# Patient Record
Sex: Male | Born: 1970 | Race: White | Hispanic: No | Smoking: Current every day smoker
Health system: Southern US, Community
[De-identification: ages and names within clinical notes are randomized; demographics above are authoritative.]

## PROBLEM LIST (undated history)

## (undated) DIAGNOSIS — Z9114 Patient's other noncompliance with medication regimen: Secondary | ICD-10-CM

## (undated) DIAGNOSIS — M549 Dorsalgia, unspecified: Secondary | ICD-10-CM

## (undated) DIAGNOSIS — Z91148 Patient's other noncompliance with medication regimen for other reason: Secondary | ICD-10-CM

## (undated) DIAGNOSIS — G8929 Other chronic pain: Secondary | ICD-10-CM

## (undated) DIAGNOSIS — I10 Essential (primary) hypertension: Secondary | ICD-10-CM

## (undated) DIAGNOSIS — F1910 Other psychoactive substance abuse, uncomplicated: Secondary | ICD-10-CM

## (undated) DIAGNOSIS — B1920 Unspecified viral hepatitis C without hepatic coma: Secondary | ICD-10-CM

## (undated) HISTORY — PX: HERNIA REPAIR: SHX51

---

## 2001-04-02 ENCOUNTER — Emergency Department (HOSPITAL_COMMUNITY): Admission: EM | Admit: 2001-04-02 | Discharge: 2001-04-02 | Payer: Self-pay | Admitting: Emergency Medicine

## 2009-03-30 ENCOUNTER — Emergency Department (HOSPITAL_COMMUNITY): Admission: EM | Admit: 2009-03-30 | Discharge: 2009-03-30 | Payer: Self-pay | Admitting: Emergency Medicine

## 2009-09-09 ENCOUNTER — Emergency Department (HOSPITAL_COMMUNITY): Admission: EM | Admit: 2009-09-09 | Discharge: 2009-09-09 | Payer: Self-pay | Admitting: Emergency Medicine

## 2010-02-07 ENCOUNTER — Emergency Department (HOSPITAL_COMMUNITY): Admission: EM | Admit: 2010-02-07 | Discharge: 2010-02-08 | Payer: Self-pay | Admitting: Emergency Medicine

## 2010-06-27 ENCOUNTER — Emergency Department (HOSPITAL_COMMUNITY)
Admission: EM | Admit: 2010-06-27 | Discharge: 2010-06-27 | Payer: Self-pay | Source: Home / Self Care | Admitting: Emergency Medicine

## 2010-09-06 ENCOUNTER — Emergency Department (HOSPITAL_COMMUNITY)
Admission: EM | Admit: 2010-09-06 | Discharge: 2010-09-06 | Disposition: A | Discharge status: Home / Self Care | Payer: Self-pay | Attending: Emergency Medicine | Admitting: Emergency Medicine

## 2010-09-06 DIAGNOSIS — X500XXA Overexertion from strenuous movement or load, initial encounter: Secondary | ICD-10-CM | POA: Insufficient documentation

## 2010-09-06 DIAGNOSIS — Y92009 Unspecified place in unspecified non-institutional (private) residence as the place of occurrence of the external cause: Secondary | ICD-10-CM | POA: Insufficient documentation

## 2010-09-06 DIAGNOSIS — M545 Low back pain, unspecified: Secondary | ICD-10-CM | POA: Insufficient documentation

## 2010-09-06 DIAGNOSIS — S335XXA Sprain of ligaments of lumbar spine, initial encounter: Secondary | ICD-10-CM | POA: Insufficient documentation

## 2010-09-06 DIAGNOSIS — B9789 Other viral agents as the cause of diseases classified elsewhere: Secondary | ICD-10-CM | POA: Insufficient documentation

## 2010-09-06 DIAGNOSIS — R011 Cardiac murmur, unspecified: Secondary | ICD-10-CM | POA: Insufficient documentation

## 2010-09-06 DIAGNOSIS — I10 Essential (primary) hypertension: Secondary | ICD-10-CM | POA: Insufficient documentation

## 2010-09-09 LAB — DIFFERENTIAL
Basophils Absolute: 0.1 10*3/uL (ref 0.0–0.1)
Basophils Relative: 1 % (ref 0–1)
Eosinophils Absolute: 0.5 10*3/uL (ref 0.0–0.7)
Eosinophils Relative: 5 % (ref 0–5)
Lymphocytes Relative: 33 % (ref 12–46)
Lymphs Abs: 3.3 10*3/uL (ref 0.7–4.0)
Monocytes Relative: 11 % (ref 3–12)
Neutro Abs: 5.1 10*3/uL (ref 1.7–7.7)
Neutrophils Relative %: 51 % (ref 43–77)

## 2010-09-09 LAB — CBC
MCHC: 35.3 g/dL (ref 30.0–36.0)
MCV: 94.3 fL (ref 78.0–100.0)
Platelets: 193 10*3/uL (ref 150–400)
RDW: 13.1 % (ref 11.5–15.5)
WBC: 10.1 10*3/uL (ref 4.0–10.5)

## 2010-09-09 LAB — RAPID URINE DRUG SCREEN, HOSP PERFORMED
Amphetamines: NOT DETECTED
Benzodiazepines: POSITIVE — AB
Opiates: POSITIVE — AB
Tetrahydrocannabinol: POSITIVE — AB

## 2010-09-09 LAB — POCT I-STAT, CHEM 8: Chloride: 108 mEq/L (ref 96–112)

## 2010-09-13 ENCOUNTER — Emergency Department (HOSPITAL_COMMUNITY)
Admission: EM | Admit: 2010-09-13 | Discharge: 2010-09-13 | Disposition: A | Discharge status: Home / Self Care | Payer: Self-pay | Attending: Emergency Medicine | Admitting: Emergency Medicine

## 2010-09-13 DIAGNOSIS — T1590XA Foreign body on external eye, part unspecified, unspecified eye, initial encounter: Secondary | ICD-10-CM | POA: Insufficient documentation

## 2010-09-13 DIAGNOSIS — Y92009 Unspecified place in unspecified non-institutional (private) residence as the place of occurrence of the external cause: Secondary | ICD-10-CM | POA: Insufficient documentation

## 2010-10-06 ENCOUNTER — Emergency Department (HOSPITAL_COMMUNITY): Payer: Self-pay

## 2010-10-06 ENCOUNTER — Emergency Department (HOSPITAL_COMMUNITY)
Admission: EM | Admit: 2010-10-06 | Discharge: 2010-10-06 | Disposition: A | Discharge status: Home / Self Care | Payer: Self-pay | Attending: Emergency Medicine | Admitting: Emergency Medicine

## 2010-10-06 DIAGNOSIS — M25476 Effusion, unspecified foot: Secondary | ICD-10-CM | POA: Insufficient documentation

## 2010-10-06 DIAGNOSIS — X500XXA Overexertion from strenuous movement or load, initial encounter: Secondary | ICD-10-CM | POA: Insufficient documentation

## 2010-10-06 DIAGNOSIS — I10 Essential (primary) hypertension: Secondary | ICD-10-CM | POA: Insufficient documentation

## 2010-10-06 DIAGNOSIS — S93409A Sprain of unspecified ligament of unspecified ankle, initial encounter: Secondary | ICD-10-CM | POA: Insufficient documentation

## 2010-10-06 DIAGNOSIS — S99919A Unspecified injury of unspecified ankle, initial encounter: Secondary | ICD-10-CM | POA: Insufficient documentation

## 2010-10-06 DIAGNOSIS — S8990XA Unspecified injury of unspecified lower leg, initial encounter: Secondary | ICD-10-CM | POA: Insufficient documentation

## 2010-10-06 DIAGNOSIS — M25473 Effusion, unspecified ankle: Secondary | ICD-10-CM | POA: Insufficient documentation

## 2010-10-06 DIAGNOSIS — Y92009 Unspecified place in unspecified non-institutional (private) residence as the place of occurrence of the external cause: Secondary | ICD-10-CM | POA: Insufficient documentation

## 2010-10-06 DIAGNOSIS — M25579 Pain in unspecified ankle and joints of unspecified foot: Secondary | ICD-10-CM | POA: Insufficient documentation

## 2010-10-06 DIAGNOSIS — Z8619 Personal history of other infectious and parasitic diseases: Secondary | ICD-10-CM | POA: Insufficient documentation

## 2012-05-12 ENCOUNTER — Emergency Department (HOSPITAL_COMMUNITY)
Admission: EM | Admit: 2012-05-12 | Discharge: 2012-05-12 | Disposition: A | Discharge status: Home / Self Care | Payer: Self-pay | Attending: Emergency Medicine | Admitting: Emergency Medicine

## 2012-05-12 ENCOUNTER — Encounter (HOSPITAL_COMMUNITY): Payer: Self-pay | Admitting: Emergency Medicine

## 2012-05-12 DIAGNOSIS — F1410 Cocaine abuse, uncomplicated: Secondary | ICD-10-CM | POA: Insufficient documentation

## 2012-05-12 DIAGNOSIS — I10 Essential (primary) hypertension: Secondary | ICD-10-CM | POA: Insufficient documentation

## 2012-05-12 DIAGNOSIS — B1920 Unspecified viral hepatitis C without hepatic coma: Secondary | ICD-10-CM | POA: Insufficient documentation

## 2012-05-12 DIAGNOSIS — F1910 Other psychoactive substance abuse, uncomplicated: Secondary | ICD-10-CM

## 2012-05-12 DIAGNOSIS — F17200 Nicotine dependence, unspecified, uncomplicated: Secondary | ICD-10-CM | POA: Insufficient documentation

## 2012-05-12 DIAGNOSIS — F1110 Opioid abuse, uncomplicated: Secondary | ICD-10-CM | POA: Insufficient documentation

## 2012-05-12 HISTORY — DX: Unspecified viral hepatitis C without hepatic coma: B19.20

## 2012-05-12 HISTORY — DX: Essential (primary) hypertension: I10

## 2012-05-12 LAB — CBC WITH DIFFERENTIAL/PLATELET
Basophils Absolute: 0.1 10*3/uL (ref 0.0–0.1)
Eosinophils Relative: 3 % (ref 0–5)
HCT: 45.5 % (ref 39.0–52.0)
Hemoglobin: 16 g/dL (ref 13.0–17.0)
MCH: 32.9 pg (ref 26.0–34.0)
MCV: 93.6 fL (ref 78.0–100.0)
Monocytes Relative: 10 % (ref 3–12)
Neutro Abs: 6.3 10*3/uL (ref 1.7–7.7)
Neutrophils Relative %: 51 % (ref 43–77)
RDW: 13.4 % (ref 11.5–15.5)
WBC: 12.4 10*3/uL — ABNORMAL HIGH (ref 4.0–10.5)

## 2012-05-12 LAB — RAPID URINE DRUG SCREEN, HOSP PERFORMED
Barbiturates: NOT DETECTED
Benzodiazepines: POSITIVE — AB
Tetrahydrocannabinol: POSITIVE — AB

## 2012-05-12 LAB — BASIC METABOLIC PANEL
Chloride: 107 mEq/L (ref 96–112)
Creatinine, Ser: 1.21 mg/dL (ref 0.50–1.35)
Glucose, Bld: 145 mg/dL — ABNORMAL HIGH (ref 70–99)
Sodium: 143 mEq/L (ref 135–145)

## 2012-05-12 MED ORDER — LORAZEPAM 1 MG PO TABS
1.0000 mg | ORAL_TABLET | Freq: Once | ORAL | Status: AC
  Administered 2012-05-12: 1 mg via ORAL
  Filled 2012-05-12: qty 1

## 2012-05-12 NOTE — ED Notes (Signed)
Pt wants help to get off pain meds and crack cocaine. Pt states he has used both this weekend.

## 2012-05-12 NOTE — ED Provider Notes (Signed)
History   This chart was scribed for Gerald Lennert, MD, by Frederik Pear, ER scribe. The patient was seen in room APA03/APA03 and the patient's care was started at 1635.    CSN: [id number]  Arrival date & time 05/12/12  1620   First MD Initiated Contact with Patient 05/12/12 1635      Chief Complaint  Patient presents with  . V70.1    (Consider location/radiation/quality/duration/timing/severity/associated sxs/prior treatment) HPI Comments: Gerald Moore is a 41 y.o. male who presents to the Emergency Department wanting assistance getting detox from crack cocaine and prescription pain medication, including oxycodone and hydrocodone. He states that he last used both in the past day. He states that his last inpatient rehab was in 2011, but he had sought inpatient several time prior. He has a h/o of hepatitis C, but he is not currently taking any medication to treat it. He denies any suicidal or homicidal ideation.     Patient is a 41 y.o. male presenting with drug/alcohol assessment and drug problem. The history is provided by the patient.  Drug / Alcohol Assessment Primary symptoms include no seizures, no hallucinations and no self-injury. This is a recurrent problem. The current episode started yesterday. The problem has been gradually worsening. Suspected agents include cocaine, prescription drugs and crack. Pertinent negatives include no fever. Associated medical issues include addiction treatment.  Drug Problem This is a recurrent problem. The current episode started yesterday. The problem occurs constantly. Treatments tried: previous inpatient treatment.    Past Medical History  Diagnosis Date  . Hypertension   . Hepatitis C     Past Surgical History  Procedure Date  . Hernia repair     No family history on file.  History  Substance Use Topics  . Smoking status: Current Every Day Smoker    Types: Cigarettes  . Smokeless tobacco: Not on file  . Alcohol Use: No        Review of Systems  Constitutional: Negative for fever and fatigue.  HENT: Negative for congestion, sinus pressure and ear discharge.   Eyes: Negative for discharge.  Respiratory: Negative for cough.   Gastrointestinal: Negative for diarrhea.  Genitourinary: Negative for frequency and hematuria.  Musculoskeletal: Negative for back pain.  Skin: Negative for rash.  Neurological: Negative for seizures.  Hematological: Negative.   Psychiatric/Behavioral: Negative for suicidal ideas, hallucinations and self-injury.  All other systems reviewed and are negative.    Allergies  Review of patient's allergies indicates no known allergies.  Home Medications  No current outpatient prescriptions on file.  BP 151/94  Pulse 105  Temp 97.9 F (36.6 C) (Oral)  Resp 20  Ht 5\' 6"  (1.676 m)  Wt 190 lb 14.4 oz (86.592 kg)  BMI 30.81 kg/m2  SpO2 99%  Physical Exam  Nursing note and vitals reviewed. Constitutional: He is oriented to person, place, and time. He appears well-developed.  HENT:  Head: Normocephalic and atraumatic.  Eyes: Conjunctivae normal and EOM are normal. No scleral icterus.  Neck: Neck supple. No thyromegaly present.  Cardiovascular: Normal rate and regular rhythm.  Exam reveals no gallop and no friction rub.   No murmur heard. Pulmonary/Chest: No stridor. He has no wheezes. He has no rales. He exhibits no tenderness.  Abdominal: He exhibits no distension. There is no tenderness. There is no rebound.  Musculoskeletal: Normal range of motion. He exhibits no edema.  Lymphadenopathy:    He has no cervical adenopathy.  Neurological: He is oriented to person, place,  and time. Coordination normal.  Skin: No rash noted. No erythema.  Psychiatric: He has a normal mood and affect. His behavior is normal.    ED Course  Procedures (including critical care time)  DIAGNOSTIC STUDIES: Oxygen Saturation is 99% on room air,  normal by my interpretation.    COORDINATION OF  CARE:  16:39- Discussed planned course of treatment with the patient, including a UA and blood work, who is agreeable at this time.  18:35- Consultation to call [facility].  22:15- Medication Orders- Lorazepam (ATIVAN) tablet 1 mg- Once.  Results for orders placed during the hospital encounter of 05/12/12  URINE RAPID DRUG SCREEN (HOSP PERFORMED)      Component Value Range   Opiates POSITIVE (*) NONE DETECTED   Cocaine POSITIVE (*) NONE DETECTED   Benzodiazepines POSITIVE (*) NONE DETECTED   Amphetamines NONE DETECTED  NONE DETECTED   Tetrahydrocannabinol POSITIVE (*) NONE DETECTED   Barbiturates NONE DETECTED  NONE DETECTED  CBC WITH DIFFERENTIAL      Component Value Range   WBC 12.4 (*) 4.0 - 10.5 K/uL   RBC 4.86  4.22 - 5.81 MIL/uL   Hemoglobin 16.0  13.0 - 17.0 g/dL   HCT 16.1  09.6 - 04.5 %   MCV 93.6  78.0 - 100.0 fL   MCH 32.9  26.0 - 34.0 pg   MCHC 35.2  30.0 - 36.0 g/dL   RDW 40.9  81.1 - 91.4 %   Platelets 254  150 - 400 K/uL   Neutrophils Relative 51  43 - 77 %   Neutro Abs 6.3  1.7 - 7.7 K/uL   Lymphocytes Relative 36  12 - 46 %   Lymphs Abs 4.4 (*) 0.7 - 4.0 K/uL   Monocytes Relative 10  3 - 12 %   Monocytes Absolute 1.3 (*) 0.1 - 1.0 K/uL   Eosinophils Relative 3  0 - 5 %   Eosinophils Absolute 0.4  0.0 - 0.7 K/uL   Basophils Relative 0  0 - 1 %   Basophils Absolute 0.1  0.0 - 0.1 K/uL  BASIC METABOLIC PANEL      Component Value Range   Sodium 143  135 - 145 mEq/L   Potassium 3.4 (*) 3.5 - 5.1 mEq/L   Chloride 107  96 - 112 mEq/L   CO2 25  19 - 32 mEq/L   Glucose, Bld 145 (*) 70 - 99 mg/dL   BUN 15  6 - 23 mg/dL   Creatinine, Ser 7.82  0.50 - 1.35 mg/dL   Calcium 95.6 (*) 8.4 - 10.5 mg/dL   GFR calc non Af Amer 73 (*) >90 mL/min   GFR calc Af Amer 85 (*) >90 mL/min  ETHANOL      Component Value Range   Alcohol, Ethyl (B) <11  0 - 11 mg/dL     Labs Reviewed - No data to display No results found.   No diagnosis found.    MDM   The chart was  scribed for me under my direct supervision.  I personally performed the history, physical, and medical decision making and all procedures in the evaluation of this patient.Gerald Lennert, MD 05/12/12 2312

## 2012-05-12 NOTE — BH Assessment (Addendum)
Assessment Note   Gerald Moore is an 41 y.o. male. Patient comes to the emergency room this evening to receive medical clearance. He reports that he is using crack cocaine; has been using since Friday night thru Saturday and has used about $700. He also uses Narcotics: Vicoden, Percocets, Roxicodone. His last use was last night, stating he used about 7 Roxicodone pills. He also uses Xanax, 1 mg; 1-2 pills per day. Also uses Cannabis, 1-2 joints per day. No ETOH. He has been to multiple inpatient treatment programs; detox and rehab programs. He has been to [facility] twice-last detox 2011. He has been to [facility] several times, completing several 14-28 day programs. He has also been to [facility]. He has been to prison once, served 18 months. Did report this is his longest sobriety. He denies SI and HI. No delusions or hallucinations noted. He states that he is at the end of his rope and this time, he wants to directly go to a program once he completes detox. He states his main trigger is money. Discussed with him different avenues to limit his access once he completes detox.   Axis I: Substance Abuse; Opioid Dependence; Cocaine Abuse, Cannabis Abuse, Benzodiazepine Abuse Axis II: Deferred Axis III: HTN, Hep C Axis IV: Financial and social stressors Axis V: GAF 50   Past Medical History:  Past Medical History  Diagnosis Date  . Hypertension   . Hepatitis C     Past Surgical History  Procedure Date  . Hernia repair     Family History: No family history on file.  Social History:  reports that he has been smoking Cigarettes.  He does not have any smokeless tobacco history on file. He reports that he uses illicit drugs (Cocaine). He reports that he does not drink alcohol.  Additional Social History:     CIWA: CIWA-Ar BP: 151/94 mmHg Pulse Rate: 105  COWS:    Allergies: No Known Allergies  Home Medications:  (Not in a hospital admission)  OB/GYN Status:  No LMP for male  patient.  General Assessment Data Location of Assessment: [facility] Assessment: Yes Living Arrangements: Alone Can pt return to current living arrangement?: Yes Admission Status: Voluntary Is patient capable of signing voluntary admission?: Yes Transfer from: Acute Hospital Referral Source: MD     Risk to self Suicidal Ideation: No Suicidal Intent: No Is patient at risk for suicide?: No Suicidal Plan?: No Access to Means: No What has been your use of drugs/alcohol within the last 12 months?:  (chronic) Previous Attempts/Gestures: No Intentional Self Injurious Behavior: None Family Suicide History: No Recent stressful life event(s): Financial Problems Persecutory voices/beliefs?: No Depression: No Substance abuse history and/or treatment for substance abuse?: Yes Suicide prevention information given to non-admitted patients: Yes  Risk to Others Homicidal Ideation: No Thoughts of Harm to Others: No Current Homicidal Intent: No Current Homicidal Plan: No Access to Homicidal Means: No History of harm to others?: No Assessment of Violence: None Noted Does patient have access to weapons?: No Criminal Charges Pending?: No Does patient have a court date: No  Psychosis Hallucinations: None noted Delusions: None noted  Mental Status Report Appear/Hygiene: Improved Eye Contact: Fair Motor Activity: Freedom of movement Speech: Logical/coherent Level of Consciousness: Alert;Quiet/awake Mood: Anxious Affect: Appropriate to circumstance Anxiety Level: Moderate Thought Processes: Relevant Judgement: Unimpaired Orientation: Person;Place;Time;Situation;Appropriate for developmental age Obsessive Compulsive Thoughts/Behaviors: Minimal  Cognitive Functioning Concentration: Decreased Memory: Recent Intact;Remote Intact IQ: Average Insight: Good Impulse Control: Good Appetite: Fair  Sleep: No Change Vegetative Symptoms: None  ADLScreening [facility] Assessment  Services) Patient's cognitive ability adequate to safely complete daily activities?: Yes Patient able to express need for assistance with ADLs?: Yes Independently performs ADLs?: Yes (appropriate for developmental age)  Abuse/Neglect [facility]) Physical Abuse: Denies Verbal Abuse: Denies Sexual Abuse: Denies  Prior Inpatient Therapy Prior Inpatient Therapy: Yes Prior Therapy Dates:  (multiple years; 2011 last inpatient) Prior Therapy Facilty/Provider(s):  ([facility], [facility]) Reason for Treatment:  (detox, rehab, hwh)  Prior Outpatient Therapy Prior Outpatient Therapy: No  ADL Screening (condition at time of admission) Patient's cognitive ability adequate to safely complete daily activities?: Yes Patient able to express need for assistance with ADLs?: Yes Independently performs ADLs?: Yes (appropriate for developmental age)       Abuse/Neglect Assessment (Assessment to be complete while patient is alone) Physical Abuse: Denies Verbal Abuse: Denies Sexual Abuse: Denies Values / Beliefs Cultural Requests During Hospitalization: None Spiritual Requests During Hospitalization: None        Additional Information 1:1 In Past 12 Months?: No CIRT Risk: No Elopement Risk: No Does patient have medical clearance?: Yes     Disposition:  Disposition Disposition of Patient: Inpatient treatment program Type of inpatient treatment program: Adult  On Site Evaluation by:  Dr. Estell Harpin Reviewed with Physician:  Dr. Estell Harpin  Referred to [facility] and [facility]  Shon Baton H 05/12/2012 9:00 PM  05/12/2012 Clarification note:  He uses Narcotics daily; about 6-8 pills, usually percocet or roxicodone. He is experiencing some mild withdrawal issues now, sweats, hot and cold, some back discomfort. He is appropriate; not complaining or asking for anything at this time.

## 2012-05-12 NOTE — ED Notes (Signed)
Patients personal bag and radio placed in locker.

## 2012-05-12 NOTE — BH Assessment (Signed)
[facility] Assessment Progress Note     05/12/2012 Patient was accepted by [facility]. Authorization number from [facility] for three days; [id number], by Loraine Leriche. Patient being transported by Mother. Patient told to go straight to [facility], no stops. Patient is cooperative, understands directive.

## 2012-05-12 NOTE — ED Notes (Signed)
Pt denies any SI/HI thoughts at this time.

## 2012-11-27 ENCOUNTER — Telehealth: Payer: Self-pay

## 2012-11-27 NOTE — Telephone Encounter (Signed)
Mother states that son has a colonoscopy scheduled soon but he will be unable to come to appt. Would like for it to be cancelled and rescheduled. I advised mother that they could call and change appt themselves but she was not sure where appt was made. Unable to find in system. Please advise

## 2012-11-27 NOTE — Telephone Encounter (Signed)
Please call.

## 2013-01-14 ENCOUNTER — Emergency Department (HOSPITAL_COMMUNITY)
Admission: EM | Admit: 2013-01-14 | Discharge: 2013-01-14 | Disposition: A | Discharge status: Home / Self Care | Payer: Self-pay | Attending: Emergency Medicine | Admitting: Emergency Medicine

## 2013-01-14 ENCOUNTER — Emergency Department (HOSPITAL_COMMUNITY): Payer: Self-pay

## 2013-01-14 ENCOUNTER — Encounter (HOSPITAL_COMMUNITY): Payer: Self-pay | Admitting: Emergency Medicine

## 2013-01-14 DIAGNOSIS — Z8619 Personal history of other infectious and parasitic diseases: Secondary | ICD-10-CM | POA: Insufficient documentation

## 2013-01-14 DIAGNOSIS — F17200 Nicotine dependence, unspecified, uncomplicated: Secondary | ICD-10-CM | POA: Insufficient documentation

## 2013-01-14 DIAGNOSIS — I10 Essential (primary) hypertension: Secondary | ICD-10-CM | POA: Insufficient documentation

## 2013-01-14 DIAGNOSIS — M25469 Effusion, unspecified knee: Secondary | ICD-10-CM | POA: Insufficient documentation

## 2013-01-14 DIAGNOSIS — M25462 Effusion, left knee: Secondary | ICD-10-CM

## 2013-01-14 MED ORDER — OXYCODONE-ACETAMINOPHEN 5-325 MG PO TABS: 2.0000 | ORAL_TABLET | ORAL | Status: DC | PRN

## 2013-01-14 MED ORDER — LISINOPRIL 10 MG PO TABS
10.0000 mg | ORAL_TABLET | Freq: Once | ORAL | Status: AC
  Administered 2013-01-14: 10 mg via ORAL
  Filled 2013-01-14: qty 1

## 2013-01-14 MED ORDER — OXYCODONE-ACETAMINOPHEN 5-325 MG PO TABS
2.0000 | ORAL_TABLET | Freq: Once | ORAL | Status: AC
  Administered 2013-01-14: 2 via ORAL
  Filled 2013-01-14: qty 2

## 2013-01-14 NOTE — ED Provider Notes (Signed)
History  This chart was scribed for Glade Nurse, PA-C working with Enid Skeens, MD by Greggory Stallion, ED scribe. This patient was seen in room TR10C/TR10C and the patient's care was started at 3:36 PM.  CSN: [id number] Arrival date & time 01/14/13  1458   Chief Complaint  Patient presents with  . Knee Pain   The history is provided by the patient. No language interpreter was used.    HPI Comments: Gerald Moore is a 42 y.o. male with PMHx of untreated hypertension who presents to the Emergency Department complaining of gradually worsening, constant throbbing left knee pain with associated left knee swelling that started 5 days ago after kneeling down to place pavers for about 8 hours. He rates his pain 10/10, localized. Pt states bending his knee makes the pain worse, but denies loss of ROM. Pt denies fever and thigh pain as associated symptoms. Pt states he has tried ice and Tylenol with no relief. Pt states he was on HTN medication but he stopped taking it because it made him dizzy. He has not sought medical attention since.   Past Medical History  Diagnosis Date  . Hypertension   . Hepatitis C    Past Surgical History  Procedure Laterality Date  . Hernia repair     History reviewed. No pertinent family history. History  Substance Use Topics  . Smoking status: Current Every Day Smoker    Types: Cigarettes  . Smokeless tobacco: Not on file  . Alcohol Use: No    Review of Systems  Constitutional: Negative for diaphoresis.  HENT: Negative for neck stiffness.   Eyes: Negative for visual disturbance.  Respiratory: Negative for apnea, chest tightness and shortness of breath.   Cardiovascular: Negative for chest pain and palpitations.  Gastrointestinal: Negative for nausea, vomiting, diarrhea and constipation.  Genitourinary: Negative for dysuria.  Musculoskeletal: Positive for joint swelling and arthralgias. Negative for gait problem.       Left knee  Skin: Negative  for rash.  Neurological: Negative for dizziness, weakness, light-headedness, numbness and headaches.    Allergies  Review of patient's allergies indicates no known allergies.  Home Medications  No current outpatient prescriptions on file.  BP 175/116  Pulse 78  Temp(Src) 98.2 F (36.8 C) (Oral)  Resp 20  SpO2 98%  Physical Exam  Nursing note and vitals reviewed. Constitutional: He is oriented to person, place, and time. He appears well-developed and well-nourished. No distress.  HENT:  Head: Normocephalic and atraumatic.  Eyes: EOM are normal. Pupils are equal, round, and reactive to light.  Neck: Normal range of motion. Neck supple.  No meningeal signs  Cardiovascular: Normal rate, regular rhythm and normal heart sounds.  Exam reveals no gallop and no friction rub.   No murmur heard. Pulmonary/Chest: Effort normal and breath sounds normal. No respiratory distress. He has no wheezes. He has no rales. He exhibits no tenderness.  Abdominal: Soft. Bowel sounds are normal. He exhibits no distension. There is no tenderness. There is no rebound and no guarding.  Musculoskeletal: Normal range of motion. He exhibits no edema and no tenderness.  5/5 strength throughout. Mild swelling. No erythema. No warmth. Mild suprapatellar effusion. Good quadricep strength on straight leg raise. No joint laxity. No limited ROM to left knee.   Neurological: He is alert and oriented to person, place, and time. No cranial nerve deficit.  Skin: Skin is warm and dry. He is not diaphoretic. No erythema.    ED Course  Procedures (  including critical care time)  DIAGNOSTIC STUDIES: Oxygen Saturation is 98% on RA, normal by my interpretation.    COORDINATION OF CARE: 4:10 PM-Discussed treatment plan which includes ibuprofen, pain medication, and knee immobilizer with pt at bedside and pt agreed to plan. Advised pt to follow up with orthopaedics.   Labs Reviewed - No data to display Dg Knee Complete 4  Views Left  01/14/2013   *RADIOLOGY REPORT*  Clinical Data: Knee pain and swelling.  No known injury.  LEFT KNEE - COMPLETE 4+ VIEW  Comparison: None.  Findings: Normal bony mineralization and alignment.  The joint spaces are maintained. No significant bony degenerative change.  No acute fracture is identified.  There is a suprapatellar joint effusion.  There is suggestion of soft tissue swelling anterior to the distal femur.  IMPRESSION: Probable suprapatellar joint effusion and soft tissue swelling anteriorly.  No acute bony abnormality.   Original Report Authenticated By: Britta Mccreedy, M.D.   1. Effusion of bursa of left knee   2. Hypertension     MDM  Imaging shows probable suprapatellar joint effusion and soft tissue swelling anteriorly with no acute bony abnormality.  Directed pt to ice injury, take acetaminophen or ibuprofen for pain, and to elevate and rest the injury when possible. Considering HPI and PE, including lack of trauma,  no erythema, no warmth, no deformity, no bony tenderness, no fever or impaired ROM, not concerned for acute bony injury or septic arthritis. Pt case seen by and discussed with Dr. Jodi Mourning who agrees with conservative treatment with ortho follow up. Pt requested something to help him to keep his knee from bending as that's when the pain increases. Will provide knee immobilizer and crutches, but cautioned the pt against using it more than a week as per Dr. Jodi Mourning to help prevent atrophy. Pt also hypertensive throughout ED visit. Did give 10mg  of Lisinopril and explained the risk of untreated hypertension. Provided pt with a resource list and emphasized the importance of follow up along with smoking cessation. Pt hypertension was asymptomatic in ED, denying headache, visual changes, nausea. Discussed reasons to seek immediate care. Patient expresses understanding and agrees with plan.     Glade Nurse, [place] 01/14/13 [phone]

## 2013-01-14 NOTE — ED Notes (Signed)
Pt c/o left knee pain x5 days after laying pavers on knees

## 2013-01-14 NOTE — ED Notes (Signed)
Nurse notified of elevated blood pressure.

## 2013-01-16 NOTE — ED Provider Notes (Signed)
Medical screening examination/treatment/procedure(s) were conducted as a shared visit with non-physician practitioner(s) or resident  and myself.  I personally evaluated the patient during the encounter and agree with the findings and plan unless otherwise indicated.  No signs of infection in ED.  Pt will likely need MRI outpt and ortho eval.  Stable knee exam. Fup discussed.   Enid Skeens, MD 01/16/13 [phone]

## 2013-01-20 ENCOUNTER — Emergency Department (HOSPITAL_COMMUNITY)
Admission: EM | Admit: 2013-01-20 | Discharge: 2013-01-20 | Disposition: A | Discharge status: Home / Self Care | Payer: Self-pay | Attending: Emergency Medicine | Admitting: Emergency Medicine

## 2013-01-20 ENCOUNTER — Encounter (HOSPITAL_COMMUNITY): Payer: Self-pay

## 2013-01-20 DIAGNOSIS — I10 Essential (primary) hypertension: Secondary | ICD-10-CM | POA: Insufficient documentation

## 2013-01-20 DIAGNOSIS — M7040 Prepatellar bursitis, unspecified knee: Secondary | ICD-10-CM | POA: Insufficient documentation

## 2013-01-20 DIAGNOSIS — F17200 Nicotine dependence, unspecified, uncomplicated: Secondary | ICD-10-CM | POA: Insufficient documentation

## 2013-01-20 DIAGNOSIS — Z8619 Personal history of other infectious and parasitic diseases: Secondary | ICD-10-CM | POA: Insufficient documentation

## 2013-01-20 DIAGNOSIS — M7052 Other bursitis of knee, left knee: Secondary | ICD-10-CM

## 2013-01-20 MED ORDER — PREDNISONE 10 MG PO TABS
60.0000 mg | ORAL_TABLET | Freq: Once | ORAL | Status: AC
  Administered 2013-01-20: 60 mg via ORAL
  Filled 2013-01-20: qty 1

## 2013-01-20 MED ORDER — PREDNISONE 10 MG PO TABS: 20.0000 mg | ORAL_TABLET | Freq: Every day | ORAL | Status: DC

## 2013-01-20 MED ORDER — HYDROCHLOROTHIAZIDE 25 MG PO TABS: 25.0000 mg | ORAL_TABLET | Freq: Every day | ORAL | Status: DC

## 2013-01-20 NOTE — ED Provider Notes (Signed)
CSN: [id number]     Arrival date & time 01/20/13  1191 History    This chart was scribed for Hilario Quarry, MD, by Yevette Edwards, ED Scribe. This patient was seen in room APA03/APA03 and the patient's care was started at 9:49 AM.   First MD Initiated Contact with Patient 01/20/13 [phone]     Chief Complaint  Patient presents with  . Knee Pain   The history is provided by the patient. No language interpreter was used.   HPI Comments: Gerald Moore is a 42 y.o. male who presents to the Emergency Department complaining of gradual-onset, constant pain to his left knee. The pt began noticing trouble with his left knee eleven days ago. 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 The pt reports that the swelling to his left knee is the same. He denies experiencing any fever or redness to the skin. He was seen for bursitis of his knee a week ago and he was given pain medication and a knee immobilizer. He states that he has been resting his knee as ordered, but he reports the pain is still present.  He does not have insurance and he is unsure if he can follow-up with an orthopedist.   Past Medical History  Diagnosis Date  . Hypertension   . Hepatitis C    Past Surgical History  Procedure Laterality Date  . Hernia repair     No family history on file. History  Substance Use Topics  . Smoking status: Current Every Day Smoker    Types: Cigarettes  . Smokeless tobacco: Not on file  . Alcohol Use: No    Review of Systems  Constitutional: Negative for fever.  Musculoskeletal: Positive for myalgias.  Skin: Negative for color change.    Allergies  Review of patient's allergies indicates no known allergies.  Home Medications   Current Outpatient Rx  Name  Route  Sig  Dispense  Refill  . Acetaminophen (TYLENOL PO)   Oral   Take 2 tablets by mouth every 6 (six) hours as needed (pain).         Marland Kitchen oxyCODONE-acetaminophen (PERCOCET/ROXICET) 5-325 MG per tablet  Oral   Take 2 tablets by mouth every 4 (four) hours as needed for pain.   6 tablet   0    Triage Vitals: BP 187/116  Pulse 74  Temp(Src) 98.5 F (36.9 C) (Oral)  Resp 18  Ht 5\' 6"  (1.676 m)  Wt 190 lb (86.183 kg)  BMI 30.68 kg/m2  SpO2 100%  Physical Exam  Nursing note and vitals reviewed. Constitutional: He is oriented to person, place, and time. He appears well-developed and well-nourished. No distress.  HENT:  Head: Normocephalic and atraumatic.  Eyes: EOM are normal.  Neck: Neck supple. No tracheal deviation present.  Cardiovascular: Normal rate.   Pulmonary/Chest: Effort normal. No respiratory distress.  Musculoskeletal: Normal range of motion.  Good bilateral pedal and dorsal pulses. Left knee is diffusely swollen on anterior aspect.  Active ROM to 90 degrees. Ligaments are all intact. Full extension.    Neurological: He is alert and oriented to person, place, and time.  Skin: Skin is warm and dry.  Psychiatric: He has a normal mood and affect. His behavior is normal.    ED Course   Medications - No data to display  DIAGNOSTIC STUDIES: Oxygen Saturation is 100% on room air, normal by my interpretation.    COORDINATION OF CARE: Discussed therapeutic aspiration  but patient declined.  9:50 AM-Discussed treatment plan with patient which includes steroids, and the patient agreed to the plan.   Procedures (including critical care time)  Labs Reviewed - No data to display No results found. No diagnosis found.  MDM  Prepatellar bursitis I personally performed the services described in this documentation, which was scribed in my presence. The recorded information has been reviewed and considered.   Hilario Quarry, MD 01/20/13 [phone]

## 2013-01-20 NOTE — ED Notes (Addendum)
Pt was seen in ed last week and told he has "fluid on his knee", unable to afford to follow up w/ specialist.  Cont. To have pain.  Is out of percocet-was given 6.  Pt has known htn, not taking any meds for treatment.

## 2013-05-12 ENCOUNTER — Encounter (HOSPITAL_COMMUNITY): Payer: Self-pay | Admitting: Emergency Medicine

## 2013-05-12 ENCOUNTER — Emergency Department (HOSPITAL_COMMUNITY)
Admission: EM | Admit: 2013-05-12 | Discharge: 2013-05-12 | Disposition: A | Discharge status: Home / Self Care | Payer: Self-pay | Attending: Emergency Medicine | Admitting: Emergency Medicine

## 2013-05-12 ENCOUNTER — Emergency Department (HOSPITAL_COMMUNITY): Payer: Self-pay

## 2013-05-12 DIAGNOSIS — F17200 Nicotine dependence, unspecified, uncomplicated: Secondary | ICD-10-CM | POA: Insufficient documentation

## 2013-05-12 DIAGNOSIS — Y929 Unspecified place or not applicable: Secondary | ICD-10-CM | POA: Insufficient documentation

## 2013-05-12 DIAGNOSIS — S20219A Contusion of unspecified front wall of thorax, initial encounter: Secondary | ICD-10-CM | POA: Insufficient documentation

## 2013-05-12 DIAGNOSIS — I10 Essential (primary) hypertension: Secondary | ICD-10-CM | POA: Insufficient documentation

## 2013-05-12 DIAGNOSIS — Z79899 Other long term (current) drug therapy: Secondary | ICD-10-CM | POA: Insufficient documentation

## 2013-05-12 DIAGNOSIS — Y939 Activity, unspecified: Secondary | ICD-10-CM | POA: Insufficient documentation

## 2013-05-12 DIAGNOSIS — S20229A Contusion of unspecified back wall of thorax, initial encounter: Secondary | ICD-10-CM | POA: Insufficient documentation

## 2013-05-12 DIAGNOSIS — IMO0002 Reserved for concepts with insufficient information to code with codable children: Secondary | ICD-10-CM | POA: Insufficient documentation

## 2013-05-12 DIAGNOSIS — S300XXA Contusion of lower back and pelvis, initial encounter: Secondary | ICD-10-CM

## 2013-05-12 DIAGNOSIS — Z8619 Personal history of other infectious and parasitic diseases: Secondary | ICD-10-CM | POA: Insufficient documentation

## 2013-05-12 DIAGNOSIS — S20211A Contusion of right front wall of thorax, initial encounter: Secondary | ICD-10-CM

## 2013-05-12 DIAGNOSIS — W12XXXA Fall on and from scaffolding, initial encounter: Secondary | ICD-10-CM | POA: Insufficient documentation

## 2013-05-12 LAB — URINALYSIS, ROUTINE W REFLEX MICROSCOPIC
Bilirubin Urine: NEGATIVE
Nitrite: NEGATIVE
Specific Gravity, Urine: 1.02 (ref 1.005–1.030)
Urobilinogen, UA: 1 mg/dL (ref 0.0–1.0)

## 2013-05-12 MED ORDER — IBUPROFEN 800 MG PO TABS: 800.0000 mg | ORAL_TABLET | Freq: Three times a day (TID) | ORAL | Status: DC

## 2013-05-12 MED ORDER — OXYCODONE-ACETAMINOPHEN 5-325 MG PO TABS
2.0000 | ORAL_TABLET | Freq: Once | ORAL | Status: AC
  Administered 2013-05-12: 2 via ORAL

## 2013-05-12 MED ORDER — CYCLOBENZAPRINE HCL 10 MG PO TABS: 10.0000 mg | ORAL_TABLET | Freq: Three times a day (TID) | ORAL | Status: DC | PRN

## 2013-05-12 MED ORDER — OXYCODONE-ACETAMINOPHEN 5-325 MG PO TABS: 1.0000 | ORAL_TABLET | ORAL | Status: DC | PRN

## 2013-05-12 MED ORDER — OXYCODONE-ACETAMINOPHEN 5-325 MG PO TABS
ORAL_TABLET | ORAL | Status: AC
  Filled 2013-05-12: qty 2

## 2013-05-12 NOTE — ED Provider Notes (Signed)
CSN: [id number]     Arrival date & time 05/12/13  1514 History   First MD Initiated Contact with Patient 05/12/13 1525     Chief Complaint  Patient presents with  . Back Pain   (Consider location/radiation/quality/duration/timing/severity/associated sxs/prior Treatment) Patient is a 42 y.o. male presenting with back pain. The history is provided by the patient.  Back Pain Location:  Lumbar spine Quality:  Stabbing Radiates to:  Does not radiate Pain severity:  Moderate Pain is:  Same all the time Onset quality:  Gradual Timing:  Constant Progression:  Unchanged Chronicity:  New Context: falling and recent injury   Relieved by:  Nothing Worsened by:  Bending, movement, twisting, sitting and lying down Ineffective treatments:  None tried Associated symptoms: no abdominal pain, no abdominal swelling, no bladder incontinence, no bowel incontinence, no chest pain, no dysuria, no fever, no headaches, no leg pain, no numbness, no paresthesias, no pelvic pain, no perianal numbness, no tingling and no weakness   Associated symptoms comment:  Right posterior rib pain   Patient states that he fell approximately 5 ft from a scaffold 2 days ago , landed on his buttocks.  Describes a sharp stabbing type pain to his lower back.  He states pain is worse with movement. He has tried over the counter medications without relief.  He denies any dysuria, hematuria, saddle anesthesias, incontinence of bladder or bowel, or abdominal pain.  He also denies head injury, neck pain or LOC  Past Medical History  Diagnosis Date  . Hypertension   . Hepatitis C    Past Surgical History  Procedure Laterality Date  . Hernia repair     No family history on file. History  Substance Use Topics  . Smoking status: Current Every Day Smoker    Types: Cigarettes  . Smokeless tobacco: Not on file  . Alcohol Use: No    Review of Systems  Constitutional: Negative for fever.  Respiratory: Negative for chest  tightness and shortness of breath.   Cardiovascular: Negative for chest pain.  Gastrointestinal: Negative for vomiting, abdominal pain, constipation and bowel incontinence.  Genitourinary: Negative for bladder incontinence, dysuria, hematuria, flank pain, decreased urine volume, difficulty urinating and pelvic pain.       No perineal numbness or incontinence of urine or feces  Musculoskeletal: Positive for arthralgias and back pain. Negative for joint swelling.       Right posterior ribs  Skin: Negative for rash.  Neurological: Negative for tingling, weakness, numbness, headaches and paresthesias.  All other systems reviewed and are negative.    Allergies  Review of patient's allergies indicates no known allergies.  Home Medications   Current Outpatient Rx  Name  Route  Sig  Dispense  Refill  . acetaminophen (TYLENOL) 500 MG tablet   Oral   Take 1,000 mg by mouth every 6 (six) hours as needed for pain.         . hydrochlorothiazide (HYDRODIURIL) 25 MG tablet   Oral   Take 1 tablet (25 mg total) by mouth daily.   30 tablet   0   . ibuprofen (ADVIL,MOTRIN) 200 MG tablet   Oral   Take 800 mg by mouth every 6 (six) hours as needed for pain.         . predniSONE (DELTASONE) 10 MG tablet   Oral   Take 2 tablets (20 mg total) by mouth daily.   15 tablet   0    BP 172/103  Pulse 71  Temp(Src)  98.4 F (36.9 C) (Oral)  Resp 16  Ht 5\' 6"  (1.676 m)  Wt 190 lb (86.183 kg)  BMI 30.68 kg/m2  SpO2 99% Physical Exam  Nursing note and vitals reviewed. Constitutional: He is oriented to person, place, and time. He appears well-developed and well-nourished. No distress.  HENT:  Head: Normocephalic and atraumatic.  Neck: Normal range of motion. Neck supple.  Cardiovascular: Normal rate, regular rhythm, normal heart sounds and intact distal pulses.   No murmur heard. Pulmonary/Chest: Effort normal and breath sounds normal. No respiratory distress. He exhibits tenderness.  Mild  ttp of the right posterior ribs.  No guarding, crepitus, bruising of edema  Abdominal: Soft. He exhibits no distension and no mass. There is no tenderness. There is no rebound and no guarding.  Musculoskeletal: He exhibits tenderness. He exhibits no edema.       Lumbar back: He exhibits tenderness and pain. He exhibits normal range of motion, no swelling, no deformity, no laceration and normal pulse.       Back:  ttp of the lower lumbar spine and paraspinal muscles.   DP pulses are brisk and symmetrical.  Distal sensation intact.  Hip Flexors/Extensors are intact  Neurological: He is alert and oriented to person, place, and time. He has normal strength. No sensory deficit. He exhibits normal muscle tone. Coordination and gait normal.  Reflex Scores:      Patellar reflexes are 2+ on the right side and 2+ on the left side.      Achilles reflexes are 2+ on the right side and 2+ on the left side. Skin: Skin is warm and dry. No rash noted.    ED Course  Procedures (including critical care time) Labs Review Results for orders placed during the hospital encounter of 05/12/13  URINALYSIS, ROUTINE W REFLEX MICROSCOPIC      Result Value Range   Color, Urine YELLOW  YELLOW   APPearance CLEAR  CLEAR   Specific Gravity, Urine 1.020  1.005 - 1.030   pH 6.0  5.0 - 8.0   Glucose, UA NEGATIVE  NEGATIVE mg/dL   Hgb urine dipstick NEGATIVE  NEGATIVE   Bilirubin Urine NEGATIVE  NEGATIVE   Ketones, ur NEGATIVE  NEGATIVE mg/dL   Protein, ur NEGATIVE  NEGATIVE mg/dL   Urobilinogen, UA 1.0  0.0 - 1.0 mg/dL   Nitrite NEGATIVE  NEGATIVE   Leukocytes, UA NEGATIVE  NEGATIVE    Imaging Review Dg Ribs Unilateral W/chest Right  05/12/2013   CLINICAL DATA:  Fall with posterior right rib pain and back pain.  EXAM: RIGHT RIBS AND CHEST - 3+ VIEW  COMPARISON:  06/27/2010.  FINDINGS: Frontal view of the chest shows midline trachea and normal heart size. Mild scarring at the right lung base. Lungs are otherwise  clear. No pleural fluid. No pneumothorax.  Dedicated views of right ribs show no definite fracture.  IMPRESSION: No acute findings.   Electronically Signed   By: Leanna Battles M.D.   On: 05/12/2013 16:29   Dg Lumbar Spine Complete  05/12/2013   CLINICAL DATA:  Status post fall  EXAM: LUMBAR SPINE - COMPLETE 4+ VIEW  COMPARISON:  None.  FINDINGS: There is an area of questionable cortical buckling along the anterior cortex of L2 centrally. The vertebral body height appears to be maintained and this may represent an incidental finding. Clinical correlation recommended. If clinically warranted further evaluation with focused CT exam is recommended. Alignment is normal. Intervertebral disc spaces are maintained. Superior endplate sclerosis and  hypertrophic spurring is identified involving L3, and S1. Areas of endplate hypertrophic spurring are identified involving L2, L3,L4 and L5.  IMPRESSION: 1. Indeterminate findings involving L2 as described above. There is an area which may represent cortical buckling and clinical correlation recommended. If clinically warranted further evaluation with focused CT is recommended. 2. Findings consistent with degenerative disc disease changes.   Electronically Signed   By: Salome Holmes M.D.   On: 05/12/2013 16:33   Dg Pelvis 1-2 Views  05/12/2013   CLINICAL DATA:  Status post fall  EXAM: PELVIS - 1-2 VIEW  COMPARISON:  None.  FINDINGS: There is no evidence of pelvic fracture or diastasis. No other pelvic bone lesions are seen.  IMPRESSION: Negative.   Electronically Signed   By: Salome Holmes M.D.   On: 05/12/2013 16:26   Ct Lumbar Spine Wo Contrast  05/12/2013   CLINICAL DATA:  Fall.  Plain films recommended CT.  EXAM: CT LUMBAR SPINE WITHOUT CONTRAST  TECHNIQUE: Multidetector CT imaging of the lumbar spine was performed without intravenous contrast administration. Multiplanar CT image reconstructions were also generated.  COMPARISON:  05/12/2013 plain film exam.   09/09/2009 CT.  FINDINGS: No lumbar spine fracture is noted. What was questioned on plain film examination represents cortical thickening.  Last fully open disk space is labeled L5-S1. Present examination incorporates from T12-L1 disc space through the S2 level.  Visualized paravertebral structures without acute abnormality noted. Internal iliac arteries are partially calcified suggestive of age advanced atherosclerosis.  T12-L1:  Negative.  L1-2:  Negative.  L2-3: Moderate bulge. Spur. Facet joint degenerative changes. Mild to slightly moderate spinal stenosis. Mild bilateral foraminal narrowing.  L3-4: Bulge. Facet joint degenerative changes. Mild to slightly moderate spinal stenosis. Mild bilateral foraminal narrowing.  L4-5: Bulge. Mild spinal stenosis slightly greater on the right. Mild to moderate bilateral foraminal narrowing.  L5-S1: Minimal bulge with superimposed right posterior lateral small disc protrusion which is partially calcified with slight impression upon the right S1 nerve root.  IMPRESSION: No lumbar spine fracture is noted. What was questioned on plain film examination represents cortical thickening.  Degenerative changes as noted above.  Mild atherosclerotic type changes internal iliac arteries.   Electronically Signed   By: Bridgett Larsson M.D.   On: 05/12/2013 17:13    EKG Interpretation   None       MDM   Localized ttp of lower lumbar spine and paraspinal muscles.  No focal neuro deficits, pt ambulates with a  Steady gait.  abd, pelvis and bilateral knees are NT.  Imaging results discussed with pt.  He agrees to rest, ice and close f/u with his PMD and return precautions also given.  Pt is feeling better, pan improved.  He appears stable for d/c.   Margan Elias L. Trisha Mangle, PA-C 05/13/13 2026

## 2013-05-12 NOTE — ED Notes (Signed)
Lower back pain after falling off of scaffold (~81ft) on Saturday. Pain to lower back since the fall.

## 2013-05-12 NOTE — ED Notes (Signed)
Fall on Sat, low back pain, alert, MAE,  Has already been seen by Iva Lento , PA

## 2013-05-16 NOTE — ED Provider Notes (Signed)
Medical screening examination/treatment/procedure(s) were performed by non-physician practitioner and as supervising physician I was immediately available for consultation/collaboration.  EKG Interpretation   None         Jalexa Pifer M Atalie Oros, MD 05/16/13 0039 

## 2013-11-18 ENCOUNTER — Telehealth: Payer: Self-pay

## 2013-11-18 NOTE — Telephone Encounter (Signed)
Wrong patient/errouneous encounter

## 2014-01-28 ENCOUNTER — Encounter (HOSPITAL_COMMUNITY): Payer: Self-pay | Admitting: Emergency Medicine

## 2014-01-28 DIAGNOSIS — F17200 Nicotine dependence, unspecified, uncomplicated: Secondary | ICD-10-CM | POA: Insufficient documentation

## 2014-01-28 DIAGNOSIS — Y990 Civilian activity done for income or pay: Secondary | ICD-10-CM | POA: Insufficient documentation

## 2014-01-28 DIAGNOSIS — Z8619 Personal history of other infectious and parasitic diseases: Secondary | ICD-10-CM | POA: Insufficient documentation

## 2014-01-28 DIAGNOSIS — IMO0002 Reserved for concepts with insufficient information to code with codable children: Secondary | ICD-10-CM | POA: Insufficient documentation

## 2014-01-28 DIAGNOSIS — Y9389 Activity, other specified: Secondary | ICD-10-CM | POA: Insufficient documentation

## 2014-01-28 DIAGNOSIS — Y9289 Other specified places as the place of occurrence of the external cause: Secondary | ICD-10-CM | POA: Insufficient documentation

## 2014-01-28 DIAGNOSIS — I10 Essential (primary) hypertension: Secondary | ICD-10-CM | POA: Insufficient documentation

## 2014-01-28 DIAGNOSIS — Z79899 Other long term (current) drug therapy: Secondary | ICD-10-CM | POA: Insufficient documentation

## 2014-01-28 DIAGNOSIS — X500XXA Overexertion from strenuous movement or load, initial encounter: Secondary | ICD-10-CM | POA: Insufficient documentation

## 2014-01-28 NOTE — ED Notes (Signed)
Pt c/o lower back pain after lifting a piece of masonry and felt a pop.

## 2014-01-29 ENCOUNTER — Emergency Department (HOSPITAL_COMMUNITY)
Admission: EM | Admit: 2014-01-29 | Discharge: 2014-01-29 | Disposition: A | Discharge status: Home / Self Care | Payer: Self-pay | Attending: Emergency Medicine | Admitting: Emergency Medicine

## 2014-01-29 DIAGNOSIS — M545 Low back pain, unspecified: Secondary | ICD-10-CM

## 2014-01-29 MED ORDER — OXYCODONE-ACETAMINOPHEN 5-325 MG PO TABS
2.0000 | ORAL_TABLET | Freq: Once | ORAL | Status: AC
  Administered 2014-01-29: 2 via ORAL
  Filled 2014-01-29: qty 2

## 2014-01-29 MED ORDER — OXYCODONE-ACETAMINOPHEN 5-325 MG PO TABS: 1.0000 | ORAL_TABLET | Freq: Three times a day (TID) | ORAL | Status: DC | PRN

## 2014-01-29 NOTE — ED Notes (Signed)
MD at bedside. 

## 2014-01-29 NOTE — ED Provider Notes (Signed)
CSN: [id number]     Arrival date & time 01/28/14  2204 History   First MD Initiated Contact with Patient 01/29/14 [phone]     Chief Complaint  Patient presents with  . 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 Hypertension   . Hepatitis C    Past Surgical History  Procedure Laterality Date  . Hernia repair     History reviewed. No pertinent family history. History  Substance Use Topics  . Smoking status: Current Every Day Smoker    Types: Cigarettes  . Smokeless tobacco: Not on file  . Alcohol Use: No    Review of Systems 10 Systems reviewed and are negative for acute change except as noted in the HPI.   Allergies  Review of patient's allergies indicates no known allergies.  Home Medications   Prior to Admission medications   Medication Sig Start Date End Date Taking? Authorizing Provider  cyclobenzaprine (FLEXERIL) 10 MG tablet Take 1 tablet (10 mg total) by mouth 3 (three) times daily as needed. 05/12/13   Tammy L. Triplett, PA-C  ibuprofen (ADVIL,MOTRIN) 200 MG tablet Take 400-800 mg by mouth every 6 (six) hours as needed for pain.     Historical Provider, MD  ibuprofen (ADVIL,MOTRIN) 800 MG tablet Take 1 tablet (800 mg total) by mouth 3 (three) times daily. 05/12/13   Tammy L. Triplett, PA-C  oxyCODONE-acetaminophen (PERCOCET) 5-325 MG per tablet Take 1-2 tablets by mouth every 8 (eight)  hours as needed for severe pain. 01/29/14   Hurman Horn, MD  oxyCODONE-acetaminophen (PERCOCET/ROXICET) 5-325 MG per tablet Take 1 tablet by mouth every 4 (four) hours as needed for severe pain. 05/12/13   Tammy L. Triplett, PA-C   BP 184/105  Pulse 85  Temp(Src) 98.9 F (37.2 C) (Oral)  Resp 20  Ht 5\' 6"  (1.676 m)  Wt 190 lb (86.183 kg)  BMI 30.68 kg/m2  SpO2 98% Physical Exam  Nursing note and vitals reviewed. Constitutional:  Awake, alert, nontoxic appearance with baseline speech.  HENT:  Head: Atraumatic.  Eyes: Pupils are equal, round, and reactive to light. Right eye exhibits no discharge. Left eye exhibits no discharge.  Neck: Neck supple.  Cardiovascular: Normal rate and regular rhythm.   No murmur heard. Pulmonary/Chest: Effort normal and breath sounds normal. No respiratory distress. He has no wheezes. He has no rales. He exhibits no tenderness.  Abdominal: Soft. Bowel sounds are normal. He exhibits no mass. There is no tenderness. There is no rebound.  Musculoskeletal: He exhibits tenderness.       Thoracic back: He exhibits no tenderness.       Lumbar back: He exhibits no tenderness.  Right SI region tenderness. Bilateral lower extremities non tender without new rashes or color change, baseline ROM with intact DP pulses, CR<2 secs all digits bilaterally, sensation baseline light touch bilaterally for pt, DTR's symmetric and intact bilaterally KJ / AJ, motor symmetric bilateral 5 / 5 hip flexion,  quadriceps, hamstrings, EHL, foot dorsiflexion, foot plantarflexion, gait somewhat antalgic but without apparent new ataxia.  Neurological: He is alert.  Mental status baseline for patient.  Upper extremity motor strength and sensation intact and symmetric bilaterally.  Skin: No rash noted.  Psychiatric: He has a normal mood and affect.    ED Course  Procedures (including critical care time) Labs Review Labs Reviewed - No data to display  Imaging Review No results  found.   EKG Interpretation None      MDM   Final diagnoses:  Acute low back pain    Patient informed of clinical course, understand medical decision-making process, and agree with plan.  I doubt any other EMC precluding discharge at this time including, but not necessarily limited to the following:SBI, AAA, cauda equina.    Hurman HornJohn M Sky Primo, MD 01/31/14 1430

## 2014-01-29 NOTE — Discharge Instructions (Signed)
SEEK IMMEDIATE MEDICAL ATTENTION IF: °New numbness, tingling, weakness, or problem with the use of your arms or legs.  °Severe back pain not relieved with medications.  °Change in bowel or bladder control.  °Increasing pain in any areas of the body (such as chest or abdominal pain).  °Shortness of breath, dizziness or fainting.  °Nausea (feeling sick to your stomach), vomiting, fever, or sweats.  Emergency Department Resource Guide °1) Find a Doctor and Pay Out of Pocket °Although you won't have to find out who is covered by your insurance plan, it is a good idea to ask around and get recommendations. You will then need to call the office and see if the doctor you have chosen will accept you as a new patient and what types of options they offer for patients who are self-pay. Some doctors offer discounts or will set up payment plans for their patients who do not have insurance, but you will need to ask so you aren't surprised when you get to your appointment. ° °2) Contact Your Local Health Department °Not all health departments have doctors that can see patients for sick visits, but many do, so it is worth a call to see if yours does. If you don't know where your local health department is, you can check in your phone book. The CDC also has a tool to help you locate your state's health department, and many state websites also have listings of all of their local health departments. ° °3) Find a Walk-in Clinic °If your illness is not likely to be very severe or complicated, you may want to try a walk in clinic. These are popping up all over the country in pharmacies, drugstores, and shopping centers. They're usually staffed by nurse practitioners or physician assistants that have been trained to treat common illnesses and complaints. They're usually fairly quick and inexpensive. However, if you have serious medical issues or chronic medical problems, these are probably not your best option. ° °No Primary Care  Doctor: °- Call [facility] at  832-8000 - they can help you locate a primary care doctor that  accepts your insurance, provides certain services, etc. °- Physician Referral Service- 1-800-533-3463 ° °Chronic Pain Problems: °Organization         Address  Phone   Notes  °[facility]  (336) 297-2271 Patients need to be referred by their primary care doctor.  ° °Medication Assistance: °Organization         Address  Phone   Notes  °[place] [facility] [address] °[place], [place] [address] (336) 641-8030 --Must be a resident of [place] °-- Must have NO insurance coverage whatsoever (no Medicaid/ Medicare, etc.) °-- The pt. MUST have a primary care doctor that directs their care regularly and follows them in the community °  °[facility]  (866) 331-1348   °[employer]  (888) 892-1162   ° °Agencies that provide inexpensive medical care: °Organization         Address  Phone   Notes  °[facility]  (336) 832-8035   °[facility]    (336) 832-7272   °[facility] [address] °[place], [place] [address] (336) 832-4777   °[facility] [address], ° (336) 271-4999   °[facility]    (336) 373-0678   °[facility]    (336) 272-1050   °[facility] ° [address], [place] Phone:  (336) 832-4444, Fax:  (336) 832-4440 Hours of Operation:  9 am - 6 pm, M-F.  Also accepts Medicaid/Medicare and self-pay.  °[facility] ° [address], [place] Phone: (336) 832-3150, Fax: (336) 832-3151. Hours of Operation:  8:30 am - 5:30 pm, M-F.  Also accepts Medicaid and self-pay.  °[facility] [place] [address], [place] Phone: (336) 878-6027   °[facility] [address], [place], [place] (336)723-1848, Ext. 123 Mondays & Thursdays: 7-9 AM.  First 15 patients are seen on a first  come, first serve basis. °  ° °Medicaid-accepting [place] Providers: ° °Organization         Address  Phone   Notes  °[facility] [address], [place] (336) 641-2100 Also accepts self-pay patients.  °[facility] [address], [place] ° (336) 856-9996   °[facility] [address], [place] (336) 288-8857   °[facility] [address] [place] [address], [place] (336) 299-7000   °Veita Bland [address], [place]  ° (336) 373-1557 Only accepts [place] Access Medicaid patients after they have their name applied to their card.  ° °Self-Pay (no insurance) in [place]: ° °Organization         Address  Phone   Notes  °Sickle Cell Patients, [facility] [address], [place] (336) 832-1970   °[facility] [address], [place] (336) 832-4400   °[facility] ° [address], [place] (336) 992-4800   °[facility]/Dr. Osei-Bonsu ° [address] [place] [address], [place] or [address], [place] (336) 841-8500 Phone number for both [place] and [place] locations is the same.  °[facility] [address], [place] (336) 299-0000   °[facility] [address] [place] [address], [place] or [address] (336) 852-7530 °(336) 878-2260   °[facility] [address], [place] (336) 350-1642, phone; (336) 294-5005, fax Sees patients 1st and 3rd Saturday of every month.  Must not qualify for public or private insurance (i.e. Medicaid, Medicare, [place] Health Choice, Veterans' Benefits) • Household income should be no more than 200% of the poverty level •The clinic cannot treat you if you are pregnant or think you are pregnant • Sexually transmitted diseases are not treated at the clinic.  ° °Dental Care: °Organization          Address  Phone  Notes  °[facility] [address], [place] (336) 641-6152 Accepts children up to age 21 who are enrolled in Medicaid or [place] Health Choice; pregnant women with a Medicaid card; and children who have applied for Medicaid or [place] Health Choice, but were declined, whose parents can pay a reduced fee at time of service.  °[facility] [place]  [address], [place] (336) 641-7733 Accepts children up to age 21 who are enrolled in Medicaid or [place] Health Choice; pregnant women with a Medicaid card; and children who have applied for Medicaid or [place] Health Choice, but were declined, whose parents can pay a reduced fee at time of service.  °[facility] ° [address], [place] (336) 641-4533 Patients are seen   by appointment only. Walk-ins are not accepted. [facility] will see patients 18 years of age and older. °Monday - Tuesday (8am-5pm) °Most Wednesdays (8:30-5pm) °$30 per visit, cash only  °[facility] ° [address], [place] (336) 641-4533 Patients are seen by appointment only. Walk-ins are not accepted. [facility] will see patients 18 years of age and older. °One Wednesday Evening (Monthly: Volunteer Based).  $30 per visit, cash only  °[facility]  (919) 537-3737 for adults; Children under age 4, call Graduate Pediatric Dentistry at (919) [phone]. Children aged 4-14, please call (919) [phone] to request a pediatric application. ° Dental services are provided in all areas of dental care including fillings, crowns and bridges, complete and partial dentures, implants, gum treatment, root canals, and extractions. Preventive care is also provided. Treatment is provided to both adults and children. °Patients are selected via a lottery and there is often a waiting list. °  °[facility] [address], °[place] ° (336) 763-8833 [email or web address] °  °[facility] [address], [place], [place] (336)723-1848, Ext. 123 Second and Fourth Thursday of each month, opens at 6:30 AM; Clinic ends at 9 AM.  Patients are seen on a first-come first-served basis, and a limited number are seen during each clinic.  ° °[facility] ° [address], [place], [place] (336) 723-7904   Eligibility Requirements °You must have lived in [place], [place], or [place] counties for at least the last three months. °  You cannot be eligible for state or federal sponsored healthcare insurance, including Veterans Administration, Medicaid, or Medicare. °  You generally cannot be eligible for healthcare insurance through your employer.  °  How to apply: °Eligibility screenings are held every Tuesday and Wednesday afternoon from 1:00 pm until 4:00 pm. You do not need an appointment for the interview!  °[facility] [address], [place], [place] 336-631-2330   °[facility]  336-342-8273   °[facility]  336-703-3100   °[facility]  336-570-6415   ° °Behavioral Health Resources in the Community: °Intensive Outpatient Programs °Organization         Address  Phone  Notes  °[facility] [address], [place], [place] 336-878-6098   °[facility] [address], [place], [place] 336-832-9800   °[facility] [address], [place], [place] ° 336-882-2125   °[facility] [address],  °[place], [place] 1-800-853-5163 or 336-641-4981   °Substance Abuse Resources °Organization         Address  Phone  Notes  °[facility]  336-882-2125   °[facility]  336-784-9470   °[facility]  336-285-9073   °[facility]  336-845-3988   °Residential & Outpatient Substance Abuse Program  1-800-659-3381   °Psychological  Services °Organization         Address  Phone  Notes  °[facility]  336- 832-9600   °[facility]  336- 378-7881   °[facility] [address], [place] 1-800-853-5163 or 336-641-4981   ° °Mobile Crisis Teams °Organization         Address  Phone  Notes  °[facility], Mobile Crisis Care Unit  1-877-626-1772   °Assertive °[facility] ° [address] [place], [place] 336-834-9664   °Sharon DeEsch [address] °[place]    336-554-5454   ° °Self-Help/Support Groups °Organization         Address  Phone             Notes  °[facility] - variety of support groups  336- 373-1402 Call for more information  °Narcotics Anonymous (NA), [facility] [address], °[place]  2 meetings at this location  ° °Residential Treatment Programs °Organization         Address  Phone  Notes  °[facility] [address],    °[place]  1-866-801-8205   °[facility] ° [address], [place], [place] 704-293-8524   °[facility] [address], [place] 336-845-3988 Admissions: 8am-3pm M-F  °[facility] [address]    °[place], [place] 336-841-1104   °[facility] [address], [place], [place] 336-379-7146   °[facility] [address]  °[place], [place] 336-285-9073   °[facility] - Intensive Outpatient [address], [place], [place] 336-852-3033   °[facility] [address]  °[place], [place] 1-877-615-2722 or 336-784-9470   °[facility] [address] [place], [place] 336-227-7417 Accepts Medicaid  °[facility] [address]  °[place] 1-800-659-3381 Substance Abuse/Addiction Treatment  ° °[place] Behavioral Health Resources °Organization         Address  Phone  Notes  °[facility]  (888)  581-9988   °Julie Brannon, PhD [address] [place], [place]   (336) 349-5553 or (336) 951-0000   °[facility]   [address] °[place], [place] (336) 349-4454   °[facility] [address], [place], [place] (336) 342-8316 Insurance/Medicaid/sponsorship through [facility]  °[facility] [address]                                    [place], [place] (336) 342-8316 Therapy/tele-psych/case  °[facility] [address]  ° [place], [place] (336) 349-2233    °Dr. Arfeen  (336) 349-4544   °[facility] [place]  [employer] [place] [facility] 1) [address], [place] °2) [address], [place] °3)  [address], [place] (336) 349-3220 °(336) 342-7768 ° °(336) 342-8140   °[place] [facility] (336) 342-1394 or (336) 342-3537 (After Hours)    ° °   °

## 2014-06-05 ENCOUNTER — Emergency Department (HOSPITAL_COMMUNITY)
Admission: EM | Admit: 2014-06-05 | Discharge: 2014-06-05 | Disposition: A | Discharge status: Home / Self Care | Payer: Self-pay | Attending: Emergency Medicine | Admitting: Emergency Medicine

## 2014-06-05 ENCOUNTER — Encounter (HOSPITAL_COMMUNITY): Payer: Self-pay | Admitting: Emergency Medicine

## 2014-06-05 DIAGNOSIS — Z79899 Other long term (current) drug therapy: Secondary | ICD-10-CM | POA: Insufficient documentation

## 2014-06-05 DIAGNOSIS — F1510 Other stimulant abuse, uncomplicated: Secondary | ICD-10-CM | POA: Insufficient documentation

## 2014-06-05 DIAGNOSIS — F1310 Sedative, hypnotic or anxiolytic abuse, uncomplicated: Secondary | ICD-10-CM | POA: Insufficient documentation

## 2014-06-05 DIAGNOSIS — Z720 Tobacco use: Secondary | ICD-10-CM | POA: Insufficient documentation

## 2014-06-05 DIAGNOSIS — F1210 Cannabis abuse, uncomplicated: Secondary | ICD-10-CM | POA: Insufficient documentation

## 2014-06-05 DIAGNOSIS — Z8619 Personal history of other infectious and parasitic diseases: Secondary | ICD-10-CM | POA: Insufficient documentation

## 2014-06-05 DIAGNOSIS — I10 Essential (primary) hypertension: Secondary | ICD-10-CM | POA: Insufficient documentation

## 2014-06-05 DIAGNOSIS — F1110 Opioid abuse, uncomplicated: Secondary | ICD-10-CM | POA: Insufficient documentation

## 2014-06-05 DIAGNOSIS — F1910 Other psychoactive substance abuse, uncomplicated: Secondary | ICD-10-CM

## 2014-06-05 DIAGNOSIS — F1410 Cocaine abuse, uncomplicated: Secondary | ICD-10-CM | POA: Insufficient documentation

## 2014-06-05 LAB — URINE MICROSCOPIC-ADD ON

## 2014-06-05 LAB — RAPID URINE DRUG SCREEN, HOSP PERFORMED
AMPHETAMINES: POSITIVE — AB
BENZODIAZEPINES: POSITIVE — AB
Barbiturates: NOT DETECTED
Cocaine: POSITIVE — AB
OPIATES: POSITIVE — AB
TETRAHYDROCANNABINOL: POSITIVE — AB

## 2014-06-05 LAB — CBC WITH DIFFERENTIAL/PLATELET
BASOS ABS: 0 10*3/uL (ref 0.0–0.1)
BASOS PCT: 0 % (ref 0–1)
EOS ABS: 0.4 10*3/uL (ref 0.0–0.7)
EOS PCT: 4 % (ref 0–5)
HEMATOCRIT: 43.9 % (ref 39.0–52.0)
Hemoglobin: 14.9 g/dL (ref 13.0–17.0)
Lymphocytes Relative: 35 % (ref 12–46)
Lymphs Abs: 3.5 10*3/uL (ref 0.7–4.0)
MCH: 33.1 pg (ref 26.0–34.0)
MCHC: 33.9 g/dL (ref 30.0–36.0)
MCV: 97.6 fL (ref 78.0–100.0)
MONO ABS: 0.7 10*3/uL (ref 0.1–1.0)
Monocytes Relative: 7 % (ref 3–12)
Neutro Abs: 5.2 10*3/uL (ref 1.7–7.7)
Neutrophils Relative %: 53 % (ref 43–77)
PLATELETS: 249 10*3/uL (ref 150–400)
RBC: 4.5 MIL/uL (ref 4.22–5.81)
RDW: 13.3 % (ref 11.5–15.5)
WBC: 9.8 10*3/uL (ref 4.0–10.5)

## 2014-06-05 LAB — BASIC METABOLIC PANEL
ANION GAP: 14 (ref 5–15)
BUN: 11 mg/dL (ref 6–23)
CALCIUM: 9.4 mg/dL (ref 8.4–10.5)
CO2: 26 meq/L (ref 19–32)
CREATININE: 0.89 mg/dL (ref 0.50–1.35)
Chloride: 103 mEq/L (ref 96–112)
Glucose, Bld: 123 mg/dL — ABNORMAL HIGH (ref 70–99)
Potassium: 3.6 mEq/L — ABNORMAL LOW (ref 3.7–5.3)
SODIUM: 143 meq/L (ref 137–147)

## 2014-06-05 LAB — URINALYSIS, ROUTINE W REFLEX MICROSCOPIC
GLUCOSE, UA: NEGATIVE mg/dL
HGB URINE DIPSTICK: NEGATIVE
KETONES UR: 15 mg/dL — AB
Leukocytes, UA: NEGATIVE
Nitrite: NEGATIVE
PROTEIN: 30 mg/dL — AB
Specific Gravity, Urine: >1.03 — ABNORMAL HIGH (ref 1.005–1.030)
UROBILINOGEN UA: 2 mg/dL — AB (ref 0.0–1.0)
pH: 5 (ref 5.0–8.0)

## 2014-06-05 NOTE — ED Notes (Signed)
MD at bedside. 

## 2014-06-05 NOTE — ED Notes (Signed)
Lab at bedside

## 2014-06-05 NOTE — ED Provider Notes (Signed)
CSN: [id number]     Arrival date & time 06/05/14  1551 History  This chart was scribed for Gerald RootsKevin E [place] Nugent, MD by Tonye RoyaltyJoshua Chen, ED Scribe. This patient was seen in room APA14/APA14 and the patient's care was started at 4:48 PM.    Chief Complaint  Patient presents with  . Medical Clearance   The history is provided by the patient. No language interpreter was used.    HPI Comments: [name] is a 43 y.o. male who presents to the Emergency Department requesting help with substance addiction. 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 He states he uses opiates every day, 15-20 pills a day, and has been using them for the past 6 years, worsening gradually. He states he initially used them for back pain from working. He denies alcohol use. He states he is not on any prescription medications now, though he notes chronic hypertension for which he was prescribed medication this summer which he stopped using because they made him dizzy. He denies past treatment for anxiety or depression. He denies recent illness or fever. He states he was in [facility] 1 year ago but is not currently in any outpatient program. He states he has no psychiatrist. He called [facility] who had beds available but [facility]; [facility] and he was not able to [facility] in time, so he was referred to this ED.  No SI. Denies etoh use or abuse. Physical health at baseline. Normal appetite.     Past Medical History  Diagnosis Date  . Hypertension   . Hepatitis C    Past Surgical History  Procedure Laterality Date  . Hernia repair     History reviewed. No pertinent family history. History  Substance Use Topics  . Smoking status: Current Every Day Smoker -- 1.00 packs/day    Types: Cigarettes  . Smokeless tobacco: Not on file  . Alcohol Use: No    Review of Systems  Constitutional: Negative for fever.  Neurological: Negative for tremors  and headaches.  Psychiatric/Behavioral: Positive for behavioral problems (Substance addiction).  All other systems reviewed and are negative.  Allergies  Review of patient's allergies indicates no known allergies.  Home Medications   Prior to Admission medications   Medication Sig Start Date End Date Taking? Authorizing Provider  acetaminophen (TYLENOL) 500 MG tablet Take 1,000 mg by mouth every 6 (six) hours as needed for mild pain or moderate pain.   Yes Historical Provider, MD  cyclobenzaprine (FLEXERIL) 10 MG tablet Take 1 tablet (10 mg total) by mouth 3 (three) times daily as needed. Patient not taking: Reported on 06/05/2014 05/12/13   Tammy L. Triplett, PA-C  ibuprofen (ADVIL,MOTRIN) 200 MG tablet Take 400-800 mg by mouth every 6 (six) hours as needed for pain.     Historical Provider, MD  ibuprofen (ADVIL,MOTRIN) 800 MG tablet Take 1 tablet (800 mg total) by mouth 3 (three) times daily. Patient not taking: Reported on 06/05/2014 05/12/13   Tammy L. Triplett, PA-C  oxyCODONE-acetaminophen (PERCOCET/ROXICET) 5-325 MG per tablet Take 1 tablet by mouth every 4 (four) hours as needed for severe pain. Patient not taking: Reported on 06/05/2014 05/12/13   Tammy L. Triplett, PA-C   BP 162/102 mmHg  Pulse 71  Temp(Src) 97.7 F (36.5 C) (Oral)  Resp 18  Ht 5\' 6"  (1.676 m)  Wt 187 lb 1.6 oz (84.868 kg)  BMI 30.21 kg/m2  SpO2 99%  Physical Exam  Constitutional: He is oriented to person, place, and time. He appears well-developed and well-nourished. No distress.  HENT:  Head: Normocephalic and atraumatic.  Eyes: Conjunctivae are normal.  Neck: Normal range of motion. Neck supple. No tracheal deviation present. No thyromegaly present.  Cardiovascular: Normal rate, regular rhythm and normal heart sounds.   No murmur heard. Pulmonary/Chest: Effort normal and breath sounds normal. No accessory muscle usage. No respiratory distress. He has no wheezes. He has no rales.  Abdominal: He  exhibits no distension. There is no tenderness.  Musculoskeletal: Normal range of motion. He exhibits no edema.  Neurological: He is alert and oriented to person, place, and time.  Skin: Skin is warm and dry.  Psychiatric: He has a normal mood and affect.  No SI. Normal mood and affect. Watching tv, conversing w family, drinking fluids.     Nursing note and vitals reviewed.   ED Course  Procedures (including critical care time)  DIAGNOSTIC STUDIES: Oxygen Saturation is 99% on room air, normal by my interpretation.    COORDINATION OF CARE: 4:55 PM Discussed treatment plan with patient at beside, the patient agrees with the plan and has no further questions at this time.   Labs Review Labs Reviewed  URINE RAPID DRUG SCREEN (HOSP PERFORMED) - Abnormal; Notable for the following:    Opiates POSITIVE (*)    Cocaine POSITIVE (*)    Benzodiazepines POSITIVE (*)    Amphetamines POSITIVE (*)    Tetrahydrocannabinol POSITIVE (*)    All other components within normal limits  URINALYSIS, ROUTINE W REFLEX MICROSCOPIC - Abnormal; Notable for the following:    Color, Urine AMBER (*)    Specific Gravity, Urine >1.030 (*)    Bilirubin Urine MODERATE (*)    Ketones, ur 15 (*)    Protein, ur 30 (*)    Urobilinogen, UA 2.0 (*)    All other components within normal limits  URINE MICROSCOPIC-ADD ON - Abnormal; Notable for the following:    Squamous Epithelial / LPF MANY (*)    Bacteria, UA FEW (*)    Casts HYALINE CASTS (*)    Crystals CA OXALATE CRYSTALS (*)    All other components within normal limits  CBC WITH DIFFERENTIAL  BASIC METABOLIC PANEL      MDM   Final diagnoses:  None   I personally performed the services described in this documentation, which was scribed in my presence. The recorded information has been reviewed and considered. Gerald RootsKevin E Creed Kail, MD    Discussed w psych team/TTS, they [facility], and indicate would be appropriate to provide  resource guide.  Pt notes long hx htn, although never on meds for same. No headache. No cp or sob. No edema.   Pt appears medically stable for d/c.  I encouraged patient and family to follow up closely with resources provide, NA, [facility].    Gerald RootsKevin E Adara Kittle, MD 06/05/14 [phone]

## 2014-06-05 NOTE — Discharge Instructions (Signed)
It was our pleasure to provide your ER care today - we hope that you feel better.  Use the resource guide provided regarding other community resources.    Your blood pressure is high today - given your history of high blood pressure, and todays elevated blood pressure, follow up with primary care doctor in coming week for recheck of blood pressure, and discussion of possibly starting blood pressure medication.    Return to ER if worse, new symptoms, fevers, persistent vomiting, trouble breathing, other medical emergency or concern.   Opioid Use Disorder Opioid use disorder is a mental disorder. It is the continued nonmedical use of opioids in spite of risks to health and well-being. Misused opioids include the street drug heroin. They also include pain medicines such as morphine, hydrocodone, oxycodone, and fentanyl. Opioids are very addictive. People who misuse opioids get an exaggerated feeling of well-being. Opioid use disorder often disrupts activities at home, work, or school. It may cause mental or physical problems.  A family history of opioid use disorder puts you at higher risk of it. People with opioid use disorder often misuse other drugs or have mental illness such as depression, posttraumatic stress disorder, or antisocial personality disorder. They also are at risk of suicide and death from overdose. SIGNS AND SYMPTOMS  Signs and symptoms of opioid use disorder include:  Use of opioids in larger amounts or over a longer period than intended.  Unsuccessful attempts to cut down or control opioid use.  A lot of time spent obtaining, using, or recovering from the effects of opioids.  A strong desire or urge to use opioids (craving).  Continued use of opioids in spite of major problems at work, school, or home because of use.  Continued use of opioids in spite of relationship problems because of use.  Giving up or cutting down on important life activities because of opioid  use.  Use of opioids over and over in situations when it is physically hazardous, such as driving a car.  Continued use of opioids in spite of a physical problem that is likely related to use. Physical problems can include:  Severe constipation.  Poor nutrition.  Infertility.  Tuberculosis.  Aspiration pneumonia.  Infections such as human immunodeficiency virus (HIV) and hepatitis (from injecting opioids).  Continued use of opioids in spite of a mental problem that is likely related to use. Mental problems can include:  Depression.  Anxiety.  Hallucinations.  Sleep problems.  Loss of sexual function.  Need to use more and more opioids to get the same effect, or lessened effect over time with use of the same amount (tolerance).  Having withdrawal symptoms when opioid use is stopped, or using opioids to reduce or avoid withdrawal symptoms. Withdrawal symptoms include:  Depressed, anxious, or irritable mood.  Nausea, vomiting, diarrhea, or intestinal cramping.  Muscle aches or spasms.  Excessive tearing or runny nose.  Dilated pupils, sweating, or hairs standing on end.  Yawning.  Fever, raised blood pressure, or fast pulse.  Restlessness or trouble sleeping. This does not apply to people taking opioids for medical reasons only. DIAGNOSIS Opioid use disorder is diagnosed by your health care provider. You may be asked questions about your opioid use and and how it affects your life. A physical exam may be done. A drug screen may be ordered. You may be referred to a mental health professional. The diagnosis of opioid use disorder requires at least two symptoms within 12 months. The type of opioid use  disorder you have depends on the number of signs and symptoms you have. The type may be:  Mild. Two or three signs and symptoms.   Moderate. Four or five signs and symptoms.   Severe. Six or more signs and symptoms. TREATMENT  Treatment is usually provided by  mental health professionals with training in substance use disorders.The following options are available:  Detoxification.This is the first step in treatment for withdrawal. It is medically supervised withdrawal with the use of medicines. These medicines lessen withdrawal symptoms. They also raise the chance of becoming opioid free.  Counseling, also known as talk therapy. Talk therapy addresses the reasons you use opioids. It also addresses ways to keep you from using again (relapse). The goals of talk therapy are to avoid relapse by:  Identifying and avoiding triggers for use.  Finding healthy ways to cope with stress.  Learning how to handle cravings.  Support groups. Support groups provide emotional support, advice, and guidance.  A medicine that blocks opioid receptors in your brain. This medicine can reduce opioid cravings that lead to relapse. This medicine also blocks the desired opioid effect when relapse occurs.  Opioids that are taken by mouth in place of the misused opioid (opioid maintenance treatment). These medicines satisfy cravings but are safer than commonly misused opioids. This often is the best option for people who continue to relapse with other treatments. HOME CARE INSTRUCTIONS   Take medicines only as directed by your health care provider.  Check with your health care provider before starting new medicines.  Keep all follow-up visits as directed by your health care provider. SEEK MEDICAL CARE IF:  You are not able to take your medicines as directed.  Your symptoms get worse. SEEK IMMEDIATE MEDICAL CARE IF:  You have serious thoughts about hurting yourself or others.  You may have taken an overdose of opioids. FOR MORE INFORMATION  National Institute on Drug Abuse: http://www.price-smith.com/www.drugabuse.gov  Substance Abuse and Mental Health Services Administration: SkateOasis.com.ptwww.samhsa.gov Document Released: 04/09/2007 Document Revised: 10/27/2013 Document Reviewed:  06/25/2013 [facility] Patient Information 2015 [place], [place]. This information is not intended to replace advice given to you by your health care provider. Make sure you discuss any questions you have with your health care provider.     Drug Abuse and Addiction  There are many types of drugs that one may become addicted to including illegal drugs (marijuana, cocaine, amphetamines, hallucinogens, and narcotics), prescription drugs (hydrocodone, codeine, and alprazolam), and other chemicals such as alcohol or nicotine. Two types of addiction exist: physical and emotional. Physical addiction usually occurs after prolonged use of a drug. However, some drugs may only take a couple uses before addiction can occur. Physical addiction is marked by withdrawal symptoms in which the person experiences negative symptoms such as sweat, anxiety, tremors, hallucinations, or cravings in the absence of using the drug. Emotional dependence is the psychological desire for the "high" that the drugs produce when taken. SYMPTOMS   Inattentiveness.  Negligence.  Forgetfulness.  Insomnia.  Mood swings. RISK INCREASES WITH:   Family history of addiction.  Personal history of addictive personality. Studies have shown that risk takers, which many athletes are, have a higher risk of addiction. PREVENTION The only adequate prevention of drug abuse is abstinence from drugs. TREATMENT  The first step in quitting substance abuse is recognizing the problem and realizing that one has the power to change. Quitting requires a plan and support from others. It is often necessary to seek medical assistance. Caregivers are  available to offer counseling, and for certain cases, medicine to diminish the physical symptoms of withdrawal. Many organizations exist such as Alcoholics Anonymous, Narcotics Anonymous, or the [employer] on Alcoholism that offer support for individuals who have chosen to quit their habits. Document  Released: 06/12/2005 Document Revised: 10/27/2013 Document Reviewed: 09/24/2008 [facility] Patient Information 2015 [place], [place]. This information is not intended to replace advice given to you by your health care provider. Make sure you discuss any questions you have with your health care provider.    Stimulant Use Disorder-Cocaine Cocaine is one of a group of powerful drugs called stimulants. Cocaine has medical uses for stopping nosebleeds and for pain control before minor nose or dental surgery. However, cocaine is misused because of the effects that it produces. These effects include:   A feeling of extreme pleasure.  Alertness.  High energy. Common street names for cocaine include coke, crack, blow, snow, and nose candy. Cocaine is snorted, dissolved in water and injected, or smoked.  Stimulants are addictive because they activate regions of the brain that produce both the pleasurable sensation of "reward" and psychological dependence. Together, these actions account for loss of control and the rapid development of drug dependence. This means you become ill without the drug (withdrawal) and need to keep using it to function.  Stimulant use disorder is use of stimulants that disrupts your daily life. It disrupts relationships with family and friends and how you do your job. Cocaine increases your blood pressure and heart rate. It can cause a heart attack or stroke. Cocaine can also cause death from irregular heart rate or seizures. SYMPTOMS Symptoms of stimulant use disorder with cocaine include:  Use of cocaine in larger amounts or over a longer period of time than intended.  Unsuccessful attempts to cut down or control cocaine use.  A lot of time spent obtaining, using, or recovering from the effects of cocaine.  A strong desire or urge to use cocaine (craving).  Continued use of cocaine in spite of major problems at work, school, or home because of use.  Continued use of cocaine  in spite of relationship problems because of use.  Giving up or cutting down on important life activities because of cocaine use.  Use of cocaine over and over in situations when it is physically hazardous, such as driving a car.  Continued use of cocaine in spite of a physical problem that is likely related to use. Physical problems can include:  Malnutrition.  Nosebleeds.  Chest pain.  High blood pressure.  A hole that develops between the part of your nose that separates your nostrils (perforated nasal septum).  Lung and kidney damage.  Continued use of cocaine in spite of a mental problem that is likely related to use. Mental problems can include:  Schizophrenia-like symptoms.  Depression.  Bipolar mood swings.  Anxiety.  Sleep problems.  Need to use more and more cocaine to get the same effect, or lessened effect over time with use of the same amount of cocaine (tolerance).  Having withdrawal symptoms when cocaine use is stopped, or using cocaine to reduce or avoid withdrawal symptoms. Withdrawal symptoms include:  Depressed or irritable mood.  Low energy or restlessness.  Bad dreams.  Poor or excessive sleep.  Increased appetite. DIAGNOSIS Stimulant use disorder is diagnosed by your health care provider. You may be asked questions about your cocaine use and how it affects your life. A physical exam may be done. A drug screen may be ordered.  You may be referred to a mental health professional. The diagnosis of stimulant use disorder requires at least two symptoms within 12 months. The type of stimulant use disorder depends on the number of signs and symptoms you have. The type may be:  Mild. Two or three signs and symptoms.  Moderate. Four or five signs and symptoms.  Severe. Six or more signs and symptoms. TREATMENT Treatment for stimulant use disorder is usually provided by mental health professionals with training in substance use disorders. The following  options are available:  Counseling or talk therapy. Talk therapy addresses the reasons you use cocaine and ways to keep you from using again. Goals of talk therapy include:  Identifying and avoiding triggers for use.  Handling cravings.  Replacing use with healthy activities.  Support groups. Support groups provide emotional support, advice, and guidance.  Medicine. Certain medicines may decrease cocaine cravings or withdrawal symptoms. HOME CARE INSTRUCTIONS  Take medicines only as directed by your health care provider.  Identify the people and activities that trigger your cocaine use and avoid them.  Keep all follow-up visits as directed by your health care provider. SEEK MEDICAL CARE IF:  Your symptoms get worse or you relapse.  You are not able to take medicines as directed. SEEK IMMEDIATE MEDICAL CARE IF:  You have serious thoughts about hurting yourself or others.  You have a seizure, chest pain, sudden weakness, or loss of speech or vision. FOR MORE INFORMATION  National Institute on Drug Abuse: http://www.price-smith.com/  Substance Abuse and Mental Health Services Administration: SkateOasis.com.pt Document Released: 06/09/2000 Document Revised: 10/27/2013 Document Reviewed: 06/25/2013 [facility] Patient Information 2015 [place], [place]. This information is not intended to replace advice given to you by your health care provider. Make sure you discuss any questions you have with your health care provider.   Hypertension Hypertension, commonly called high blood pressure, is when the force of blood pumping through your arteries is too strong. Your arteries are the blood vessels that carry blood from your heart throughout your body. A blood pressure reading consists of a higher number over a lower number, such as 110/72. The higher number (systolic) is the pressure inside your arteries when your heart pumps. The lower number (diastolic) is the pressure inside your arteries when your  heart relaxes. Ideally you want your blood pressure below 120/80. Hypertension forces your heart to work harder to pump blood. Your arteries may become narrow or stiff. Having hypertension puts you at risk for heart disease, stroke, and other problems.  RISK FACTORS Some risk factors for high blood pressure are controllable. Others are not.  Risk factors you cannot control include:   Race. You may be at higher risk if you are African American.  Age. Risk increases with age.  Gender. Men are at higher risk than women before age 23 years. After age 10, women are at higher risk than men. Risk factors you can control include:  Not getting enough exercise or physical activity.  Being overweight.  Getting too much fat, sugar, calories, or salt in your diet.  Drinking too much alcohol. SIGNS AND SYMPTOMS Hypertension does not usually cause signs or symptoms. Extremely high blood pressure (hypertensive crisis) may cause headache, anxiety, shortness of breath, and nosebleed. DIAGNOSIS  To check if you have hypertension, your health care provider will measure your blood pressure while you are seated, with your arm held at the level of your heart. It should be measured at least twice using the same arm. Certain conditions  can cause a difference in blood pressure between your right and left arms. A blood pressure reading that is higher than normal on one occasion does not mean that you need treatment. If one blood pressure reading is high, ask your health care provider about having it checked again. TREATMENT  Treating high blood pressure includes making lifestyle changes and possibly taking medicine. Living a healthy lifestyle can help lower high blood pressure. You may need to change some of your habits. Lifestyle changes may include:  Following the DASH diet. This diet is high in fruits, vegetables, and whole grains. It is low in salt, red meat, and added sugars.  Getting at least 2 hours of  brisk physical activity every week.  Losing weight if necessary.  Not smoking.  Limiting alcoholic beverages.  Learning ways to reduce stress. If lifestyle changes are not enough to get your blood pressure under control, your health care provider may prescribe medicine. You may need to take more than one. Work closely with your health care provider to understand the risks and benefits. HOME CARE INSTRUCTIONS  Have your blood pressure rechecked as directed by your health care provider.   Take medicines only as directed by your health care provider. Follow the directions carefully. Blood pressure medicines must be taken as prescribed. The medicine does not work as well when you skip doses. Skipping doses also puts you at risk for problems.   Do not smoke.   Monitor your blood pressure at home as directed by your health care provider. SEEK MEDICAL CARE IF:   You think you are having a reaction to medicines taken.  You have recurrent headaches or feel dizzy.  You have swelling in your ankles.  You have trouble with your vision. SEEK IMMEDIATE MEDICAL CARE IF:  You develop a severe headache or confusion.  You have unusual weakness, numbness, or feel faint.  You have severe chest or abdominal pain.  You vomit repeatedly.  You have trouble breathing. MAKE SURE YOU:   Understand these instructions.  Will watch your condition.  Will get help right away if you are not doing well or get worse. Document Released: 06/12/2005 Document Revised: 10/27/2013 Document Reviewed: 04/04/2013 [facility] Patient Information 2015 [place], [place]. This information is not intended to replace advice given to you by your health care provider. Make sure you discuss any questions you have with your health care provider.       Emergency Department Resource Guide 1) Find a Doctor and Pay Out of Pocket Although you won't have to find out who is covered by your insurance plan, it is a good  idea to ask around and get recommendations. You will then need to call the office and see if the doctor you have chosen will accept you as a new patient and what types of options they offer for patients who are self-pay. Some doctors offer discounts or will set up payment plans for their patients who do not have insurance, but you will need to ask so you aren't surprised when you get to your appointment.  2) Contact Your Local Health Department Not all health departments have doctors that can see patients for sick visits, but many do, so it is worth a call to see if yours does. If you don't know where your local health department is, you can check in your phone book. The CDC also has a tool to help you locate your state's health department, and many state websites also have listings of all  of their local health departments.  3) Find a Walk-in Clinic If your illness is not likely to be very severe or complicated, you may want to try a walk in clinic. These are popping up all over the country in pharmacies, drugstores, and shopping centers. They're usually staffed by nurse practitioners or physician assistants that have been trained to treat common illnesses and complaints. They're usually fairly quick and inexpensive. However, if you have serious medical issues or chronic medical problems, these are probably not your best option.  No Primary Care Doctor: - Call Health Connect at  [phone] - they can help you locate a primary care doctor that  accepts your insurance, provides certain services, etc. - Physician Referral Service- [phone]  Chronic Pain Problems: Organization         Address  Phone   Notes  [facility]  [phone] Patients need to be referred by their primary care doctor.   Medication Assistance: Organization         Address  Phone   Notes  [facility] Medication [facility] [address] [place] [place], [place] [address] [phone]  --Must be a resident of [facility] -- Must have NO insurance coverage whatsoever (no Medicaid/ Medicare, etc.) -- The pt. MUST have a primary care doctor that directs their care regularly and follows them in the community   [facility]  [phone]   [employer]  [phone]    Agencies that provide inexpensive medical care: Organization         Address  Phone   Notes  [facility]  [phone]   [facility]    [phone]   [facility] [address] [place], [place] [address] [phone]   [facility] [address], [place] [phone]   [facility]    [phone]   [facility]    [phone]   [facility]  [address], [place] Phone:  [phone], Fax:  [phone] Hours of Operation:  9 am - 6 pm, M-F.  Also accepts Medicaid/Medicare and self-pay.  [facility]  [address], [place] Phone: [phone], Fax: [phone]. Hours of Operation:  8:30 am - 5:30 pm, M-F.  Also accepts Medicaid and self-pay.  [facility] [place] [address] [place] Phone: [phone]   [facility] [address] Natasha Bence [place], [place] [phone], Ext. [phone] Mondays & Thursdays: 7-9 AM.  First 15 patients are seen on a first come, first serve basis.    Medicaid-accepting [facility] Providers:  Organization         Address  Phone   Notes  [facility] [address], [place] [phone] Also accepts self-pay patients.  [facility] [address] Laurell Josephs [place], [place]  [phone]   [facility] [address], [place] [phone]   [facility] [address], [place] [phone]   Renaye Rakers [address], [place]   [phone] Only  accepts [place] patients after they have their name applied to their card.   Self-Pay (no insurance) in [facility]:  Organization         Address  Phone   Notes  [facility]  Cell Patients, [facility] [address] [place], [place] [phone]   [facility] [address] [place], [place] [phone]   [facility] [place]  [address], [place] [phone]   [facility]/Dr. Osei-Bonsu  [address], [place] or [address], [place] [phone] Phone number for both [place] and [place] locations is the same.  [facility] [address] [place] [phone]   [facility] [address], [place] or [address] [phone]   [facility] [address] [place] [phone], phone; [phone], fax Sees patients 1st and 3rd Saturday of every month.  Must not qualify for public or private insurance (i.e. Medicaid, Medicare, [place] Health Choice, Veterans' Benefits)  Household income should be no more than 200% of the poverty level The clinic cannot treat you if you are pregnant or think you are pregnant  Sexually transmitted diseases are not treated at the clinic.    Dental Care: Organization         Address  Phone  Notes  [facility] [address] [place], [place] [phone] Accepts children up to age 1 who are enrolled in [place] or [place] Health Choice; pregnant women with a Medicaid card; and children who have applied for Medicaid or [place] Health Choice, but were declined, whose parents can pay a reduced fee at time of service.  [facility]  [address], [place] [phone] Accepts children up to age 56 who are enrolled in [place] or [place] Health Choice; pregnant  women with a Medicaid card; and children who have applied for Medicaid or [place] Health Choice, but were declined, whose parents can pay a reduced fee at time of service.  [facility]  [address] [place], [place] [phone] Patients are seen by appointment only. Walk-ins are not accepted. [facility] will see patients 41 years of age and older. Monday - Tuesday (8am-5pm) Most Wednesdays (8:30-5pm) $30 per visit, cash only  [facility] Adult Dental Access PROGRAM  [address], [facility] [phone] Patients are seen by appointment only. Walk-ins are not accepted. [facility] will see patients 35 years of age and older. One Wednesday Evening (Monthly: Volunteer Based).  $30 per visit, cash only  [employer]  [phone] for adults; Children under age 49, call Graduate Pediatric Dentistry at [phone]. Children aged 72-14, please call [phone] to request a pediatric application.  Dental services are provided in all areas of dental care including fillings, crowns and bridges, complete and partial dentures, implants, gum treatment, root canals, and extractions. Preventive care is also provided. Treatment is provided to both adults and children. Patients are selected via a lottery and there is often a waiting list.   [facility] [address], [place]  [phone] [email or web address]   [facility] [address] [place], [place] [phone], Ext. [phone] Second and Fourth Thursday of each month, opens at 6:30 AM; Clinic ends at 9 AM.  Patients are seen on a first-come first-served basis, and a limited number are seen during each clinic.   [facility]  [address] [place], [place] [phone]   Eligibility Requirements You  must have lived in [place], [place], or [place] counties for at least the last three months.   You cannot be eligible for state or federal sponsored  [place], including [employer], [place], or [employer].   You generally cannot be eligible for healthcare insurance through your employer.    How to apply: Eligibility screenings are held every Tuesday and Wednesday afternoon from 1:00 pm until 4:00 pm. You do not need an appointment for the interview!  [facility] [address], [place], [place] [phone]   [facility] Health Department  [phone]   [facility] Health Department  [phone]   [facility] Health Department  [phone]    Behavioral Health Resources in the Community: Intensive Outpatient Programs Organization         Address  Phone  Notes  [facility] [address], [place], [place] [phone]   [facility] Outpatient [address] [place], [place] [phone]   [facility] [address] [place], [place]  [phone]   [facility] [address],  [place], [place] [phone] or [phone]   Substance Abuse Resources Organization         Address  Phone  Notes  [facility]  [phone]   [facility]  [phone]   [facility]  [phone]   Floydene Flock  [phone]   Residential & Outpatient Substance Abuse Program  [phone]   Psychological Services Organization         Address  Phone  Notes  [facility] Health  [phone]   [facility]  [phone]   [facility] [address], [place] [phone] or [phone]    Mobile Crisis Teams Organization         Address  Phone  Notes  [facility], Mobile Crisis Care Unit  [phone]   [facility]  [address] [place], [place] [phone]   Doristine Locks [address] [place] [phone]    Self-Help/Support Groups Organization         Address  Phone              Notes  [facility] [place] - variety of support groups  336- I7437963 Call for more information  Narcotics Anonymous (NA), [facility] [address], [employer]   2 meetings at this location   Statistician         Address  Phone  Notes  [facility] [address],    [place]  [phone]   [facility]  [address] [place] [address], [place], [place] [phone]   [facility] [address] [place], [place] [phone] Admissions: 8am-3pm M-F  [facility] [address]    [place], [place] [phone]   [facility] [address] [place], [place], [place] [phone]   [facility] [address],  [place], [place] [phone]   [facility] - Intensive Outpatient [address], [place], [place] [phone]   [facility] ([facility] [address] [place]  [place], [place] [phone] or [phone]   [facility] ([facility] [address], [place], [place] [phone] Accepts Medicaid  [facility] [address],  [place] [phone] Substance Abuse/Addiction Treatment   [facility] Resources Organization         Address  Phone  Notes  [facility]  [phone]   Angie Fava, PhD [address] Knack [place], [place]   [phone] or [phone]   [facility] Behavioral   [address] [place], [place] [phone]   [facility] [address], [place], [place] [phone] Insurance/Medicaid/sponsorship through [facility] [address]                                    [place], [place] [phone] Therapy/tele-psych/case  [facility] [address], [place] [phone]    Dr. Lolly Mustache  [phone]   [facility]  [employer] [facility] Dept. 1) [address],  2) [address] [place] 3)  [address], [place] [phone]  [phone]   [facility] Child Abuse Hotline (249)691-6823 or [phone] (After Hours)

## 2014-06-05 NOTE — ED Notes (Addendum)
Pt comes in to get off of cocaine and opiates. Pt states he uses Opana, percocet, vicoden, dilaudid, xanax, and oxycontin. Pt denies using alcohol. Pt [facility] [place] [facility] and [facility] [employer] [facility]. [facility] so came here. Pt states if he goes home he knows he will use. Pt denies being SI/HI and pt has no AVD.  Pt states he feels depressed because he knows his decisions aren't the best and hates that he has hurt his family. Pt in NAD at this time. Mother at bedside.

## 2014-06-05 NOTE — ED Notes (Addendum)
Pt reports wants help "getting off of cocaine,benzos,opiates." pt denies any alcohol use, si/hi,hearing voices/delusions.pt alert and cooperative in triage.

## 2014-08-30 ENCOUNTER — Emergency Department (HOSPITAL_COMMUNITY)
Admission: EM | Admit: 2014-08-30 | Discharge: 2014-08-30 | Disposition: A | Discharge status: Home / Self Care | Payer: Self-pay | Attending: Emergency Medicine | Admitting: Emergency Medicine

## 2014-08-30 ENCOUNTER — Encounter (HOSPITAL_COMMUNITY): Payer: Self-pay | Admitting: Emergency Medicine

## 2014-08-30 DIAGNOSIS — Z8619 Personal history of other infectious and parasitic diseases: Secondary | ICD-10-CM | POA: Insufficient documentation

## 2014-08-30 DIAGNOSIS — Z720 Tobacco use: Secondary | ICD-10-CM | POA: Insufficient documentation

## 2014-08-30 DIAGNOSIS — Z791 Long term (current) use of non-steroidal anti-inflammatories (NSAID): Secondary | ICD-10-CM | POA: Insufficient documentation

## 2014-08-30 DIAGNOSIS — I10 Essential (primary) hypertension: Secondary | ICD-10-CM | POA: Insufficient documentation

## 2014-08-30 DIAGNOSIS — Z79899 Other long term (current) drug therapy: Secondary | ICD-10-CM | POA: Insufficient documentation

## 2014-08-30 DIAGNOSIS — M5431 Sciatica, right side: Secondary | ICD-10-CM | POA: Insufficient documentation

## 2014-08-30 MED ORDER — TRAMADOL HCL 50 MG PO TABS: 50.0000 mg | ORAL_TABLET | Freq: Four times a day (QID) | ORAL | Status: DC | PRN

## 2014-08-30 MED ORDER — TRAMADOL HCL 50 MG PO TABS
50.0000 mg | ORAL_TABLET | Freq: Once | ORAL | Status: AC
  Administered 2014-08-30: 50 mg via ORAL
  Filled 2014-08-30: qty 1

## 2014-08-30 MED ORDER — CYCLOBENZAPRINE HCL 10 MG PO TABS
10.0000 mg | ORAL_TABLET | Freq: Once | ORAL | Status: AC
  Administered 2014-08-30: 10 mg via ORAL
  Filled 2014-08-30: qty 1

## 2014-08-30 MED ORDER — PREDNISONE 50 MG PO TABS
60.0000 mg | ORAL_TABLET | Freq: Once | ORAL | Status: AC
  Administered 2014-08-30: 60 mg via ORAL
  Filled 2014-08-30 (×2): qty 1

## 2014-08-30 MED ORDER — PREDNISONE 10 MG PO TABS: ORAL_TABLET | ORAL | Status: DC

## 2014-08-30 MED ORDER — CYCLOBENZAPRINE HCL 5 MG PO TABS: 5.0000 mg | ORAL_TABLET | Freq: Three times a day (TID) | ORAL | Status: DC

## 2014-08-30 NOTE — ED Provider Notes (Signed)
CSN: [id number]     Arrival date & time 08/30/14  1318 History  This chart was scribed for Burgess AmorJulie Kabria Hetzer, PA, working with Raelyn NumberKristen N Ward, DO by Elon SpannerGarrett Cook, ED Scribe. This patient was seen in room APFT20/APFT20 and the patient's care was started at 2:28 PM.   Chief Complaint  Patient presents with  . Back Pain   The history is provided by the patient. No language interpreter was used.   HPI Comments: Gerald Moore is a 44 y.o. 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   Patient has taken Tylenol, most recently this morning, without relief.  He has also used icy hot but not tried ice/heat.  Patient reports he has previously been seen in ED after falling off scaffolding where he received a CT scan that showed arthritic lower back changes.  NKA.  Patient denies bowel/bladder incontinence, numbness, weakness.    Also denies fevers, chills, nausea or vomiting.  PCP: none Past Medical History  Diagnosis Date  . Hypertension   . Hepatitis C    Past Surgical History  Procedure Laterality Date  . Hernia repair     No family history on file. History  Substance Use Topics  . Smoking status: Current Every Day Smoker -- 1.00 packs/day    Types: Cigarettes  . Smokeless tobacco: Not on file  . Alcohol Use: No    Review of Systems  Constitutional: Negative for fever.  Respiratory: Negative for shortness of breath.   Cardiovascular: Negative for chest pain and leg swelling.  Gastrointestinal: Negative for abdominal pain, constipation and abdominal distention.  Genitourinary: Negative for dysuria, urgency, frequency, flank pain and difficulty urinating.  Musculoskeletal: Positive for back pain. Negative for joint swelling and gait problem.  Skin: Negative for  rash.  Neurological: Negative for weakness and numbness.  All other systems reviewed and are negative.     Allergies  Review of patient's allergies indicates no known allergies.  Home Medications   Prior to Admission medications   Medication Sig Start Date End Date Taking? Authorizing Provider  acetaminophen (TYLENOL) 500 MG tablet Take 1,000 mg by mouth every 6 (six) hours as needed for mild pain or moderate pain.    Historical Provider, MD  cyclobenzaprine (FLEXERIL) 5 MG tablet Take 1 tablet (5 mg total) by mouth 3 (three) times daily. 08/30/14   Burgess AmorJulie Aayushi Solorzano, PA-C  ibuprofen (ADVIL,MOTRIN) 200 MG tablet Take 400-800 mg by mouth every 6 (six) hours as needed for pain.     Historical Provider, MD  ibuprofen (ADVIL,MOTRIN) 800 MG tablet Take 1 tablet (800 mg total) by mouth 3 (three) times daily. Patient not taking: Reported on 06/05/2014 05/12/13   Tammi Triplett, PA-C  oxyCODONE-acetaminophen (PERCOCET/ROXICET) 5-325 MG per tablet Take 1 tablet by mouth every 4 (four) hours as needed for severe pain. Patient not taking: Reported on 06/05/2014 05/12/13   Tammi Triplett, PA-C  predniSONE (DELTASONE) 10 MG tablet 6, 5, 4, 3, 2 then 1 tablet by mouth daily for 6 days total. 08/30/14   Burgess AmorJulie Haylie Mccutcheon, PA-C  traMADol (ULTRAM) 50 MG tablet Take 1 tablet (50 mg total) by mouth every 6 (six) hours as needed for moderate pain. 08/30/14   Burgess AmorJulie Mykaylah Ballman, PA-C   BP 146/99 mmHg  Pulse 92  Temp(Src) 98.2 F (36.8 C) (Oral)  Resp 18  Ht  (1.676 m)  Wt 192 lb (87.091 kg)  BMI 31.00 kg/m2  SpO2 97% Physical Exam  Constitutional: He appears well-developed and well-nourished.  HENT:  Head: Normocephalic.  Eyes: Conjunctivae are normal.  Neck: Normal range of motion. Neck supple.  Cardiovascular: Normal rate and intact distal pulses.   Pedal pulses normal.  Pulmonary/Chest: Effort normal.  Abdominal: Soft. Bowel sounds are normal. He exhibits no distension and no mass.  Musculoskeletal: Normal range  of motion. He exhibits no edema.       Lumbar back: He exhibits tenderness. He exhibits no swelling, no edema and no spasm.  No midline lumbar pain but right paralumbar pain which radiates to his posterior proximal thigh.    Neurological: He is alert. He has normal strength. He displays no atrophy and no tremor. No sensory deficit. Gait normal.  Reflex Scores:      Patellar reflexes are 2+ on the right side and 2+ on the left side.      Achilles reflexes are 2+ on the right side and 2+ on the left side. No strength deficit noted in hip and knee flexor and extensor muscle groups.  Ankle flexion and extension intact.  Skin: Skin is warm and dry.  Psychiatric: He has a normal mood and affect.  Nursing note and vitals reviewed.   ED Course  Procedures (including critical care time)  DIAGNOSTIC STUDIES: Oxygen Saturation is 97% on RA, normal by my interpretation.    COORDINATION OF CARE:  2:34 PM Discussed treatment plan with patient at bedside.  Patient acknowledges and agrees with plan.    Labs Review Labs Reviewed - No data to display  Imaging Review No results found.   EKG Interpretation None      MDM   Final diagnoses:  Sciatica, right    No neuro deficit on exam or by history to suggest emergent or surgical presentation.  Also discussed worsened sx that should prompt immediate re-evaluation including distal weakness, bowel/bladder retention/incontinence.  Pt prescribed flexeril, prednisone taper, tramadol. Advised recheck if not improved over the next week.  I personally performed the services described in this documentation, which was scribed in my presence. The recorded information has been reviewed and is accurate.   Burgess Amor, PA-C 09/01/14 1143  Layla Maw Ward, DO 09/01/14 2336

## 2014-08-30 NOTE — ED Notes (Signed)
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 PT c/o pain radiating down right leg and denies any urinary symptoms.

## 2014-08-30 NOTE — Discharge Instructions (Signed)
Take your next dose of prednisone tomorrow morning.  Use the the other medicines as directed.  Do not drive within 4 hours of taking tramadol or flexeril as they can make you drowsy.  Avoid lifting,  Bending,  Twisting or any other activity that worsens your pain over the next week.  Apply a heating pad to your lower back for 10-15 minutes several times daily.  You should get rechecked if your symptoms are not better over the next 5 days,  Or you develop increased pain,  Weakness in your leg(s) or loss of bladder or bowel function - these are symptoms of a worse injury.   Sciatica Sciatica is pain, weakness, numbness, or tingling along your sciatic nerve. The nerve starts in the lower back and runs down the back of each leg. Nerve damage or certain conditions pinch or put pressure on the sciatic nerve. This causes the pain, weakness, and other discomforts of sciatica. HOME CARE   Only take medicine as told by your doctor.  Apply ice to the affected area for 20 minutes. Do this 3-4 times a day for the first 48-72 hours. Then try heat in the same way.  Exercise, stretch, or do your usual activities if these do not make your pain worse.  Go to physical therapy as told by your doctor.  Keep all doctor visits as told.  Do not wear high heels or shoes that are not supportive.  Get a firm mattress if your mattress is too soft to lessen pain and discomfort. GET HELP RIGHT AWAY IF:   You cannot control when you poop (bowel movement) or pee (urinate).  You have more weakness in your lower back, lower belly (pelvis), butt (buttocks), or legs.  You have redness or puffiness (swelling) of your back.  You have a burning feeling when you pee.  You have pain that gets worse when you lie down.  You have pain that wakes you from your sleep.  Your pain is worse than past pain.  Your pain lasts longer than 4 weeks.  You are suddenly losing weight without reason. MAKE SURE YOU:   Understand these  instructions.  Will watch this condition.  Will get help right away if you are not doing well or get worse. Document Released: 03/21/2008 Document Revised: 12/12/2011 Document Reviewed: 10/22/2011 [facility] Patient Information 2015 [place], [place]. This information is not intended to replace advice given to you by your health care provider. Make sure you discuss any questions you have with your health care provider.    Emergency Department Resource Guide 1) Find a Doctor and Pay Out of Pocket Although you won't have to find out who is covered by your insurance plan, it is a good idea to ask around and get recommendations. You will then need to call the office and see if the doctor you have chosen will accept you as a new patient and what types of options they offer for patients who are self-pay. Some doctors offer discounts or will set up payment plans for their patients who do not have insurance, but you will need to ask so you aren't surprised when you get to your appointment.  2) Contact Your Local Health Department Not all health departments have doctors that can see patients for sick visits, but many do, so it is worth a call to see if yours does. If you don't know where your local health department is, you can check in your phone book. The CDC also has a  tool to help you locate your state's health department, and many state websites also have listings of all of their local health departments.  3) Find a Walk-in Clinic If your illness is not likely to be very severe or complicated, you may want to try a walk in clinic. These are popping up all over the country in pharmacies, drugstores, and shopping centers. They're usually staffed by nurse practitioners or physician assistants that have been trained to treat common illnesses and complaints. They're usually fairly quick and inexpensive. However, if you have serious medical issues or chronic medical problems, these are probably not your best  option.  No Primary Care Doctor: - Call Health Connect at  [phone] - they can help you locate a primary care doctor that  accepts your insurance, provides certain services, etc. - Physician Referral Service- [phone]  Chronic Pain Problems: Organization         Address  Phone   Notes  [facility]  [phone] Patients need to be referred by their primary care doctor.   Medication Assistance: Organization         Address  Phone   Notes  [facility] Medication [facility] [address] [place] [place], [place] [address] [phone] --Must be a resident of [facility] -- Must have NO insurance coverage whatsoever (no Medicaid/ Medicare, etc.) -- The pt. MUST have a primary care doctor that directs their care regularly and follows them in the community   [facility]ist  [phone]   [employer]  [phone]    Agencies that provide inexpensive medical care: Organization         Address  Phone   Notes  [facility]  [phone]   [facility]    [phone]   [facility] [address] [place], [place] [address] [phone]   [facility] [address] [place], [place] [phone]   Planned Parenthood    [phone]   [facility]    [phone]   [facility]  [address], [place] Phone:  [phone], Fax:  [phone] Hours of Operation:  9 am - 6 pm, M-F.  Also accepts Medicaid/Medicare and self-pay.  [facility]  [address], [place] Phone: [phone], Fax: [phone]. Hours of Operation:  8:30 am - 5:30 pm, M-F.  Also accepts Medicaid and self-pay.  [facility] [place] [address] [place] Phone: [phone]   [facility] [address] Natasha Bence [place], [place] [phone], Ext. [phone] Mondays & Thursdays: 7-9 AM.  First 15  patients are seen on a first come, first serve basis.    Medicaid-accepting [facility] Providers:  Organization         Address  Phone   Notes  [facility] [address], [place] [phone] Also accepts self-pay patients.  [facility] [address] Laurell Josephs [place], [place]  [phone]   [facility] [address], [place] [phone]   [facility] [address], [place] [phone]   Renaye Rakers [address], [place]   [phone] Only accepts [place] Access [place] patients after they have their name applied to their card.   Self-Pay (no insurance) in [place]:  Organization         Address  Phone   Notes  Sickle Cell Patients, [facility] [address] [place], [place] [phone]   [facility] [address] [place], [place] [phone]   [facility]  [address], [place] [phone]   [facility]/Dr. Osei-Bonsu  [address] [place] or [address], [place] [phone] Phone number for both [place] and [place] locations is the same.  [facility] [address] [place] [phone]   [facility] [address], [place] or [address] [phone]   [facility] [address], [place] [phone], phone; [phone], fax Sees patients 1st and 3rd Saturday of every month.  Must not qualify for public or private insurance (i.e. Medicaid, Medicare, [place] Health Choice, Veterans' Benefits)  Household income should be no more than 200% of the poverty level The clinic cannot treat you if you are pregnant or think you are pregnant  Sexually transmitted diseases are not treated at the clinic.    Dental  Care: Organization         Address  Phone  Notes  [facility] [address] [place], [place] [phone] Accepts children up to age 58 who are enrolled in [place] or [place] Health Choice; pregnant women with a Medicaid card; and children who have applied for Medicaid or [place] Health Choice, but were declined, whose parents can pay a reduced fee at time of service.  [facility]  [address], [place] [phone] Accepts children up to age 76 who are enrolled in [place] or [place] Health Choice; pregnant women with a Medicaid card; and children who have applied for Medicaid or  Health Choice, but were declined, whose parents can pay a reduced fee at time of service.  [facility]  [address] [place], [place] [phone] Patients are seen by appointment only. Walk-ins are not accepted. [facility] will see patients 23 years of age and older. Monday - Tuesday (8am-5pm) Most Wednesdays (8:30-5pm) $30 per visit, cash only  [facility]  [address], [facility] [phone] Patients are seen by appointment only. Walk-ins are not accepted. [facility] will see patients 65 years of age and older. One Wednesday Evening (Monthly: Volunteer Based).  $30 per visit, cash only  [employer]  [phone] for adults; Children under age 61, call Graduate Pediatric Dentistry at [phone]. Children aged 19-14, please call [phone] to request a pediatric application.  Dental services are provided in all areas of dental care including fillings, crowns and bridges, complete and partial dentures, implants, gum treatment, root canals, and extractions. Preventive care is also provided. Treatment is provided to both adults and children. Patients are selected via a lottery and there is often a waiting list.   [facility] [address] [place]  [phone] [email or web address]   [facility] [address] [place], [place] [phone], Ext. [phone] Second and Fourth Thursday of each month, opens at 6:30 AM; Clinic ends at 9 AM.  Patients are seen on a first-come first-served basis, and a limited number are seen during each clinic.  [facility]  [address] Ether Griffins [place], [place] [phone]   Eligibility Requirements You must have lived in [place], [place], or [place] counties for at least the last three months.   You cannot be eligible for state or federal sponsored [place], including [employer], [place], or [employer].   You generally cannot be eligible for healthcare insurance through your employer.    How to apply: Eligibility screenings are held every Tuesday and Wednesday afternoon from 1:00 pm until 4:00 pm. You do not need an appointment for the interview!  [facility] [address], [place], [place] [phone]   [facility] Health Department  [phone]   [facility] Health Department  [phone]   [facility]  [phone]    Behavioral Health Resources in the Community: Intensive Outpatient Programs Organization         Address  Phone  Notes  [facility] [address] [place], [place] [phone]   [facility] [address], [place], [place] [phone]   [facility] [address], [place], [place]  [phone]   [facility] [address],  [place], [place] [phone] or [phone]   Substance Abuse Resources Organization         Address  Phone  Notes  [facility]  [phone]   [facility]  [phone]   [facility]  [phone]   Floydene Flock  [phone]   Residential & Outpatient Substance Abuse Program  [phone]    Psychological Services Organization         Address  Phone  Notes  [facility]  [phone]   [facility] Services  [phone]   [facility] [address] [place] [phone] or [phone]    Mobile Crisis Teams Organization         Address  Phone  Notes  [facility], Mobile Crisis Care Unit  [phone]   [facility]  [address] [place], [place] [phone]   Doristine Locks [address] [place] [phone]    Self-Help/Support Groups Organization         Address  Phone             Notes  [facility] [place] - variety of support groups  [phone]- I7437963 Call for more information  Narcotics Anonymous (NA), [facility] [address], [employer] [place]  2 meetings at this location   Statistician         Address  Phone  Notes  [facility] [address],    [place]  [phone]   [facility]  [address] [place] [address], [place], [place] [phone]   [facility] [address] [place], [place] [phone] Admissions: 8am-3pm M-F  [facility] [address]    [place], [place] [phone]   [facility] [address] Starling Manns [place], [place] [phone]   [facility] [address],  [place], [place] [phone]   [facility] - Intensive Outpatient [address] Laurell Joseph[address], [place], [place] [phone]   [facility] ([facility]iction [facility] [address] [place]  [place], [place] [phone] or [phone]   [facility] ([facility]) [address], [place], [place] [phone] Accepts Medicaid  [facility] [address],  [place] [phone] Substance Abuse/Addiction Treatment  [facility] Behavioral Health Resources Organization         Address  Phone  Notes  [facility]  [phone]) [phone]   Angie FavaJulie Brannon, PhD [address] [place], [place]   [phone]) [phone] or [phone](336) [phone]   [facility]   [address] [place], [place] [phone]) [phone]   [facility] [address], [place], [place] [phone]) [phone] Insurance/Medicaid/sponsorship through [facility]  [facility] [address]                                    [place], [place] [phone] Therapy/tele-psych/case  [facility] [address]   [place], [place] [phone]) [phone]    Dr. Lolly MustacheArfeen  [phone](336) [phone]   [facility] [place]  [employer] [facility] 1) [address], [place] 2) [address], [place] 3)  [address], [place] [phone]) [phone]  [phone]) [phone]   [facility] [place] Child Abuse Hotline [phone]) [phone] or [phone]) [phone] (After Hours)

## 2015-04-28 ENCOUNTER — Encounter (HOSPITAL_COMMUNITY): Payer: Self-pay | Admitting: Emergency Medicine

## 2015-04-28 ENCOUNTER — Inpatient Hospital Stay (HOSPITAL_COMMUNITY)
Admission: EM | Admit: 2015-04-28 | Discharge: 2015-05-01 | DRG: 418 | Disposition: A | Discharge status: Home / Self Care | Payer: Self-pay | Attending: General Surgery | Admitting: General Surgery

## 2015-04-28 ENCOUNTER — Emergency Department (HOSPITAL_COMMUNITY): Payer: Self-pay

## 2015-04-28 DIAGNOSIS — K8012 Calculus of gallbladder with acute and chronic cholecystitis without obstruction: Principal | ICD-10-CM | POA: Diagnosis present

## 2015-04-28 DIAGNOSIS — I10 Essential (primary) hypertension: Secondary | ICD-10-CM | POA: Diagnosis present

## 2015-04-28 DIAGNOSIS — Z720 Tobacco use: Secondary | ICD-10-CM

## 2015-04-28 DIAGNOSIS — F1410 Cocaine abuse, uncomplicated: Secondary | ICD-10-CM | POA: Diagnosis present

## 2015-04-28 DIAGNOSIS — K810 Acute cholecystitis: Secondary | ICD-10-CM | POA: Diagnosis present

## 2015-04-28 DIAGNOSIS — K812 Acute cholecystitis with chronic cholecystitis: Secondary | ICD-10-CM

## 2015-04-28 DIAGNOSIS — F17210 Nicotine dependence, cigarettes, uncomplicated: Secondary | ICD-10-CM | POA: Diagnosis present

## 2015-04-28 DIAGNOSIS — D72829 Elevated white blood cell count, unspecified: Secondary | ICD-10-CM

## 2015-04-28 DIAGNOSIS — B1920 Unspecified viral hepatitis C without hepatic coma: Secondary | ICD-10-CM | POA: Diagnosis present

## 2015-04-28 DIAGNOSIS — F1210 Cannabis abuse, uncomplicated: Secondary | ICD-10-CM | POA: Diagnosis present

## 2015-04-28 DIAGNOSIS — K821 Hydrops of gallbladder: Secondary | ICD-10-CM | POA: Diagnosis present

## 2015-04-28 HISTORY — DX: Dorsalgia, unspecified: M54.9

## 2015-04-28 HISTORY — DX: Other psychoactive substance abuse, uncomplicated: F19.10

## 2015-04-28 HISTORY — DX: Other chronic pain: G89.29

## 2015-04-28 LAB — COMPREHENSIVE METABOLIC PANEL
ALBUMIN: 3.9 g/dL (ref 3.5–5.0)
ALK PHOS: 65 U/L (ref 38–126)
ALT: 22 U/L (ref 17–63)
ANION GAP: 9 (ref 5–15)
AST: 24 U/L (ref 15–41)
BUN: 20 mg/dL (ref 6–20)
CALCIUM: 9.4 mg/dL (ref 8.9–10.3)
CO2: 27 mmol/L (ref 22–32)
CREATININE: 1.09 mg/dL (ref 0.61–1.24)
Chloride: 99 mmol/L — ABNORMAL LOW (ref 101–111)
GFR calc Af Amer: >60 mL/min (ref >60)
GFR calc non Af Amer: >60 mL/min (ref >60)
GLUCOSE: 99 mg/dL (ref 65–99)
Potassium: 4.2 mmol/L (ref 3.5–5.1)
Sodium: 135 mmol/L (ref 135–145)
Total Bilirubin: 1.3 mg/dL — ABNORMAL HIGH (ref 0.3–1.2)
Total Protein: 7.8 g/dL (ref 6.5–8.1)

## 2015-04-28 LAB — URINALYSIS, ROUTINE W REFLEX MICROSCOPIC
GLUCOSE, UA: NEGATIVE mg/dL
Hgb urine dipstick: NEGATIVE
LEUKOCYTES UA: NEGATIVE
Nitrite: NEGATIVE
PH: 5.5 (ref 5.0–8.0)
Protein, ur: 30 mg/dL — AB
Urobilinogen, UA: 4 mg/dL — ABNORMAL HIGH (ref 0.0–1.0)

## 2015-04-28 LAB — CBC WITH DIFFERENTIAL/PLATELET
Basophils Absolute: 0 10*3/uL (ref 0.0–0.1)
Basophils Relative: 0 %
EOS PCT: 1 %
Eosinophils Absolute: 0.2 10*3/uL (ref 0.0–0.7)
HEMATOCRIT: 45.1 % (ref 39.0–52.0)
Hemoglobin: 15.5 g/dL (ref 13.0–17.0)
LYMPHS ABS: 3.2 10*3/uL (ref 0.7–4.0)
LYMPHS PCT: 15 %
MCH: 32.3 pg (ref 26.0–34.0)
MCHC: 34.4 g/dL (ref 30.0–36.0)
MCV: 94 fL (ref 78.0–100.0)
MONO ABS: 2.8 10*3/uL — AB (ref 0.1–1.0)
Monocytes Relative: 13 %
Neutro Abs: 15.6 10*3/uL — ABNORMAL HIGH (ref 1.7–7.7)
Neutrophils Relative %: 71 %
PLATELETS: 223 10*3/uL (ref 150–400)
RBC: 4.8 MIL/uL (ref 4.22–5.81)
RDW: 13.7 % (ref 11.5–15.5)
WBC: 21.8 10*3/uL — ABNORMAL HIGH (ref 4.0–10.5)

## 2015-04-28 LAB — RAPID URINE DRUG SCREEN, HOSP PERFORMED
AMPHETAMINES: NOT DETECTED
BARBITURATES: NOT DETECTED
Benzodiazepines: NOT DETECTED
Cocaine: NOT DETECTED
Opiates: POSITIVE — AB
TETRAHYDROCANNABINOL: NOT DETECTED

## 2015-04-28 LAB — URINE MICROSCOPIC-ADD ON

## 2015-04-28 LAB — LIPASE, BLOOD: Lipase: 27 U/L (ref 11–51)

## 2015-04-28 MED ORDER — HEPARIN SODIUM (PORCINE) 5000 UNIT/ML IJ SOLN
5000.0000 [IU] | Freq: Three times a day (TID) | INTRAMUSCULAR | Status: DC
Start: 2015-04-28 — End: 2015-04-30
  Administered 2015-04-28 – 2015-04-29 (×3): 5000 [IU] via SUBCUTANEOUS
  Filled 2015-04-28 (×4): qty 1

## 2015-04-28 MED ORDER — SENNOSIDES-DOCUSATE SODIUM 8.6-50 MG PO TABS
1.0000 | ORAL_TABLET | Freq: Every evening | ORAL | Status: DC | PRN
  Filled 2015-04-28: qty 1

## 2015-04-28 MED ORDER — ONDANSETRON HCL 4 MG PO TABS: 4.0000 mg | ORAL_TABLET | Freq: Four times a day (QID) | ORAL | Status: DC | PRN

## 2015-04-28 MED ORDER — SODIUM CHLORIDE 0.9 % IV SOLN
INTRAVENOUS | Status: DC
  Administered 2015-04-28: 14:00:00 via INTRAVENOUS

## 2015-04-28 MED ORDER — IOHEXOL 300 MG/ML  SOLN
100.0000 mL | Freq: Once | INTRAMUSCULAR | Status: AC | PRN
  Administered 2015-04-28: 100 mL via INTRAVENOUS

## 2015-04-28 MED ORDER — SODIUM CHLORIDE 0.9 % IV SOLN: INTRAVENOUS | Status: AC

## 2015-04-28 MED ORDER — PIPERACILLIN-TAZOBACTAM 3.375 G IVPB
3.3750 g | Freq: Three times a day (TID) | INTRAVENOUS | Status: DC
  Filled 2015-04-28 (×3): qty 50

## 2015-04-28 MED ORDER — ONDANSETRON HCL 4 MG/2ML IJ SOLN: 4.0000 mg | Freq: Four times a day (QID) | INTRAMUSCULAR | Status: DC | PRN

## 2015-04-28 MED ORDER — SODIUM CHLORIDE 0.9 % IV SOLN
INTRAVENOUS | Status: DC
  Administered 2015-04-28 – 2015-05-01 (×3): via INTRAVENOUS

## 2015-04-28 MED ORDER — ONDANSETRON HCL 4 MG/2ML IJ SOLN
4.0000 mg | INTRAMUSCULAR | Status: DC | PRN
  Administered 2015-04-28: 4 mg via INTRAVENOUS
  Filled 2015-04-28: qty 2

## 2015-04-28 MED ORDER — PIPERACILLIN-TAZOBACTAM 3.375 G IVPB 30 MIN
3.3750 g | INTRAVENOUS | Status: AC
  Administered 2015-04-28: 3.375 g via INTRAVENOUS
  Filled 2015-04-28: qty 50

## 2015-04-28 MED ORDER — MORPHINE SULFATE (PF) 2 MG/ML IV SOLN
1.0000 mg | INTRAVENOUS | Status: DC | PRN
  Administered 2015-04-28 – 2015-04-29 (×3): 1 mg via INTRAVENOUS
  Filled 2015-04-28 (×3): qty 1

## 2015-04-28 MED ORDER — PIPERACILLIN-TAZOBACTAM 3.375 G IVPB
3.3750 g | Freq: Three times a day (TID) | INTRAVENOUS | Status: DC
  Administered 2015-04-28 – 2015-05-01 (×8): 3.375 g via INTRAVENOUS
  Filled 2015-04-28 (×15): qty 50

## 2015-04-28 MED ORDER — HYDROMORPHONE HCL 1 MG/ML IJ SOLN
1.0000 mg | INTRAMUSCULAR | Status: DC | PRN
  Administered 2015-04-28: 1 mg via INTRAVENOUS
  Filled 2015-04-28: qty 1

## 2015-04-28 MED ORDER — NICOTINE 14 MG/24HR TD PT24
14.0000 mg | MEDICATED_PATCH | Freq: Every day | TRANSDERMAL | Status: DC
  Administered 2015-04-28 – 2015-05-01 (×3): 14 mg via TRANSDERMAL
  Filled 2015-04-28 (×3): qty 1

## 2015-04-28 MED ORDER — ONDANSETRON HCL 4 MG/2ML IJ SOLN: 4.0000 mg | Freq: Three times a day (TID) | INTRAMUSCULAR | Status: DC | PRN

## 2015-04-28 MED ORDER — NICOTINE 21 MG/24HR TD PT24
21.0000 mg | MEDICATED_PATCH | Freq: Every day | TRANSDERMAL | Status: DC
  Administered 2015-04-28: 21 mg via TRANSDERMAL
  Filled 2015-04-28: qty 1

## 2015-04-28 MED ORDER — FENTANYL CITRATE (PF) 100 MCG/2ML IJ SOLN
50.0000 ug | INTRAMUSCULAR | Status: AC | PRN
  Administered 2015-04-28 (×2): 50 ug via INTRAVENOUS
  Filled 2015-04-28 (×2): qty 2

## 2015-04-28 NOTE — ED Notes (Signed)
Attempted to call report. Charge nurse on 3rd floor reports they are unable to accept report at this time.

## 2015-04-28 NOTE — ED Notes (Signed)
Patient reports RLQ pain since Saturday with vomiting. Denies fevers or diarrhea.

## 2015-04-28 NOTE — Progress Notes (Signed)
ANTIBIOTIC CONSULT NOTE-Preliminary  Pharmacy Consult for Zosyn Indication: intra-abdominal infection  No Known Allergies  Patient Measurements: Height: 5\' 6"  (167.6 cm) Weight: 162 lb (73.483 kg) IBW/kg (Calculated) : 63.8  Vital Signs: Temp: 98.3 F (36.8 C) (11/02 1033) Temp Source: Oral (11/02 1033) BP: 116/76 mmHg (11/02 1230) Pulse Rate: 80 (11/02 1230)  Labs:  Recent Labs  04/28/15 1110  WBC 21.8*  HGB 15.5  PLT 223  CREATININE 1.09    Estimated Creatinine Clearance: 78 mL/min (by C-G formula based on Cr of 1.09).  No results for input(s): VANCOTROUGH, VANCOPEAK, VANCORANDOM, GENTTROUGH, GENTPEAK, GENTRANDOM, TOBRATROUGH, TOBRAPEAK, TOBRARND, AMIKACINPEAK, AMIKACINTROU, AMIKACIN in the last 72 hours.   Microbiology: No results found for this or any previous visit (from the past 720 hour(s)).  Medical History: Past Medical History  Diagnosis Date  . Hypertension   . Hepatitis C   . Chronic back pain   . Polysubstance abuse     benzos, opiates, cocaine, mariuana   Assessment: Pt c/o RLQ pain with vomiting since Saturday.  Estimated Creatinine Clearance: 78 mL/min (by C-G formula based on Cr of 1.09).  Goal of Therapy:  Eradicate infection.  Plan:  Preliminary review of pertinent patient information completed.  Protocol will be initiated with  Zosyn 3.375gm IV q8h EID.   Monitor renal fxn, progress and cultures  Valrie HartHall, Kori Goins A, RPH 04/28/2015,1:14 PM

## 2015-04-28 NOTE — ED Provider Notes (Signed)
CSN: [id number]     Arrival date & time 04/28/15  1028 History   First MD Initiated Contact with Patient 04/28/15 1034     Chief Complaint  Patient presents with  . Abdominal Pain      HPI Pt was seen at 1040.  Per pt, c/o gradual onset and persistence of constant right sided abd "pain" for the past 4 days.  Has been associated with multiple intermittent episodes of N/V.  Describes the abd pain as "aching."  Denies any change in his usual chronic back pain. Denies diarrhea, no fevers, no back pain, no rash, no CP/SOB, no black or blood in stools or emesis, no dysuria/hematuria, no testicular pain/swelling.       Past Medical History  Diagnosis Date  . Hypertension   . Hepatitis C   . Chronic back pain   . Polysubstance abuse     benzos, opiates, cocaine, mariuana   Past Surgical History  Procedure Laterality Date  . Hernia repair      Social History  Substance Use Topics  . Smoking status: Current Every Day Smoker -- 1.00 packs/day    Types: Cigarettes  . Smokeless tobacco: None  . Alcohol Use: No    Review of Systems ROS: Statement: All systems negative except as marked or noted in the HPI; Constitutional: Negative for fever and chills. ; ; Eyes: Negative for eye pain, redness and discharge. ; ; ENMT: Negative for ear pain, hoarseness, nasal congestion, sinus pressure and sore throat. ; ; Cardiovascular: Negative for chest pain, palpitations, diaphoresis, dyspnea and peripheral edema. ; ; Respiratory: Negative for cough, wheezing and stridor. ; ; Gastrointestinal: +N/V, abd pain. Negative for diarrhea, blood in stool, hematemesis, jaundice and rectal bleeding. . ; ; Genitourinary: Negative for dysuria, flank pain and hematuria. ; ; Musculoskeletal: Negative for neck pain. Negative for swelling and trauma.; ; Skin: Negative for pruritus, rash, abrasions, blisters, bruising and skin lesion.; ; Neuro: Negative for headache, lightheadedness and neck stiffness. Negative for weakness,  altered level of consciousness , altered mental status, extremity weakness, paresthesias, involuntary movement, seizure and syncope.      Allergies  Review of patient's allergies indicates no known allergies.  Home Medications   Prior to Admission medications   Medication Sig Start Date End Date Taking? Authorizing Provider  acetaminophen (TYLENOL) 500 MG tablet Take 1,000 mg by mouth every 6 (six) hours as needed for mild pain or moderate pain.   Yes Historical Provider, MD  cyclobenzaprine (FLEXERIL) 5 MG tablet Take 1 tablet (5 mg total) by mouth 3 (three) times daily. Patient not taking: Reported on 04/28/2015 08/30/14   Burgess Amor, PA-C  predniSONE (DELTASONE) 10 MG tablet 6, 5, 4, 3, 2 then 1 tablet by mouth daily for 6 days total. Patient not taking: Reported on 04/28/2015 08/30/14   Burgess Amor, PA-C   BP 162/112 mmHg  Pulse 100  Temp(Src) 98.3 F (36.8 C) (Oral)  Resp 20  Ht  (1.676 m)  Wt 162 lb (73.483 kg)  BMI 26.16 kg/m2  SpO2 100%   Filed Vitals:   04/28/15 1111 04/28/15 1130 04/28/15 1200 04/28/15 1230  BP: 116/88 134/99 125/87 116/76  Pulse:  81 79 80  Temp:      TempSrc:      Resp:      Height:      Weight:      SpO2:  97% 99% 97%     Physical Exam  1045: Physical examination:  Nursing notes  reviewed; Vital signs and O2 SAT reviewed;  Constitutional: Well developed, Well nourished, Well hydrated, In no acute distress; Head:  Normocephalic, atraumatic; Eyes: EOMI, PERRL, No scleral icterus; ENMT: Mouth and pharynx normal, Mucous membranes moist; Neck: Supple, Full range of motion, No lymphadenopathy; Cardiovascular: Regular rate and rhythm, No gallop; Respiratory: Breath sounds clear & equal bilaterally, No wheezes.  Speaking full sentences with ease, Normal respiratory effort/excursion; Chest: Nontender, Movement normal; Abdomen: Soft, +RUQ > RLQ tenderness to palp. No rebound or guarding. Nondistended, Normal bowel sounds; Genitourinary: No CVA tenderness;  Spine:  No midline CS, TS, LS tenderness. +TTP right lumbar paraspinal muscles.;; Extremities: Pulses normal, No tenderness, No edema, No calf edema or asymmetry.; Neuro: AA&Ox3, Major CN grossly intact.  Speech clear. No gross focal motor or sensory deficits in extremities. Climbs on and off stretcher easily by himself. Gait steady.; Skin: Color normal, Warm, Dry.   ED Course  Procedures (including critical care time) Labs Review   Imaging Review  I have personally reviewed and evaluated these images and lab results as part of my medical decision-making.   EKG Interpretation None      MDM  MDM Reviewed: previous chart, nursing note and vitals Reviewed previous: labs Interpretation: labs and CT scan     Results for orders placed or performed during the hospital encounter of 04/28/15  Comprehensive metabolic panel  Result Value Ref Range   Sodium 135 135 - 145 mmol/L   Potassium 4.2 3.5 - 5.1 mmol/L   Chloride 99 (L) 101 - 111 mmol/L   CO2 27 22 - 32 mmol/L   Glucose, Bld 99 65 - 99 mg/dL   BUN 20 6 - 20 mg/dL   Creatinine, Ser 1.611.09 0.61 - 1.24 mg/dL   Calcium 9.4 8.9 - 09.610.3 mg/dL   Total Protein 7.8 6.5 - 8.1 g/dL   Albumin 3.9 3.5 - 5.0 g/dL   AST 24 15 - 41 U/L   ALT 22 17 - 63 U/L   Alkaline Phosphatase 65 38 - 126 U/L   Total Bilirubin 1.3 (H) 0.3 - 1.2 mg/dL   GFR calc non Af Amer >60 >60 mL/min   GFR calc Af Amer >60 >60 mL/min   Anion gap 9 5 - 15  Lipase, blood  Result Value Ref Range   Lipase 27 11 - 51 U/L  CBC with Differential  Result Value Ref Range   WBC 21.8 (H) 4.0 - 10.5 K/uL   RBC 4.80 4.22 - 5.81 MIL/uL   Hemoglobin 15.5 13.0 - 17.0 g/dL   HCT 04.545.1 40.939.0 - 81.152.0 %   MCV 94.0 78.0 - 100.0 fL   MCH 32.3 26.0 - 34.0 pg   MCHC 34.4 30.0 - 36.0 g/dL   RDW 91.413.7 78.211.5 - 95.615.5 %   Platelets 223 150 - 400 K/uL   Neutrophils Relative % 71 %   Neutro Abs 15.6 (H) 1.7 - 7.7 K/uL   Lymphocytes Relative 15 %   Lymphs Abs 3.2 0.7 - 4.0 K/uL   Monocytes  Relative 13 %   Monocytes Absolute 2.8 (H) 0.1 - 1.0 K/uL   Eosinophils Relative 1 %   Eosinophils Absolute 0.2 0.0 - 0.7 K/uL   Basophils Relative 0 %   Basophils Absolute 0.0 0.0 - 0.1 K/uL  Urinalysis, Routine w reflex microscopic  Result Value Ref Range   Color, Urine AMBER (A) YELLOW   APPearance CLEAR CLEAR   Specific Gravity, Urine >1.030 (H) 1.005 - 1.030   pH 5.5  5.0 - 8.0   Glucose, UA NEGATIVE NEGATIVE mg/dL   Hgb urine dipstick NEGATIVE NEGATIVE   Bilirubin Urine MODERATE (A) NEGATIVE   Ketones, ur TRACE (A) NEGATIVE mg/dL   Protein, ur 30 (A) NEGATIVE mg/dL   Urobilinogen, UA 4.0 (H) 0.0 - 1.0 mg/dL   Nitrite NEGATIVE NEGATIVE   Leukocytes, UA NEGATIVE NEGATIVE  Urine microscopic-add on  Result Value Ref Range   WBC, UA 0-2 <3 WBC/hpf   RBC / HPF 0-2 <3 RBC/hpf   Casts GRANULAR CAST (A) NEGATIVE   Ct Abdomen Pelvis W Contrast 04/28/2015  CLINICAL DATA:  Right lower quadrant pain and vomiting. EXAM: CT ABDOMEN AND PELVIS WITH CONTRAST TECHNIQUE: Multidetector CT imaging of the abdomen and pelvis was performed using the standard protocol following bolus administration of intravenous contrast. CONTRAST:  OMNIPAQUE IOHEXOL 300 MG/ML  SOLN COMPARISON:  None. FINDINGS: Lower chest: Slight linear scarring or atelectasis at the right lung base. Heart size is normal. Hepatobiliary: There is marked thickening of the gallbladder wall at 8 mm. There are several gallstones, 1 measuring 17 mm and 2 others which are tiny. There is slight soft tissue stranding and edema in the fat around the edematous gallbladder. No dilated bile ducts. Slight hepatomegaly. History of hepatitis-C. No focal liver lesions. Pancreas: Normal. Spleen: Normal. Adrenals/Urinary Tract: Normal. Stomach/Bowel: The bowel is normal including the terminal ileum and appendix. Vascular/Lymphatic: Minimal calcification in the common iliac arteries. No significant adenopathy. Reproductive: Normal. Other: No free air or  free fluid. Musculoskeletal: Multilevel degenerative disc disease in the lumbar spine. No acute abnormality. IMPRESSION: Findings consistent with acute on chronic cholecystitis with marked thickening of the gallbladder wall, cholelithiasis, and soft tissue stranding in the adjacent peritoneal fat. Electronically Signed   By: Francene Boyers M.D.   On: 04/28/2015 12:18    1230:  Acute on chronic cholecystitis on CT scan; IV zosyn started. Pt will remain NPO. Dx and testing d/w pt.  Questions answered.  Verb understanding, agreeable to admit.  T/C to General Surgery Dr. Lovell Sheehan, case discussed, including:  HPI, pertinent PM/SHx, VS/PE, dx testing, ED course and treatment:  Agreeable to consult, requests to admit to medicine service.  T/C to [facility] Dr. Ardyth Harps, case discussed, including:  HPI, pertinent PM/SHx, VS/PE, dx testing, ED course and treatment:  Agreeable to admit, requests to write temporary orders, obtain observation medical bed to team APAdmits.   Samuel Jester, DO 05/02/15 2030

## 2015-04-28 NOTE — H&P (Signed)
[facility]          History and Physical    PCP:   No PCP Per Patient   EDP: Francine Graven, D.O.  Chief Complaint:  Right sided abdominal pain, nausea, vomiting  HPI: Patient is a 44 year old man without much in regards to past medical history other than tobacco abuse as well as marijuana and cocaine abuse who presents to the hospital with a 5 day history of right-sided abdominal pain. He has been unable to tolerate any by mouth's. States he is vomiting a green substance, no blood. Has been quite nauseous. He decided to seek help emergency department today where he is found to have a white count of 22,000. CT scan of the abdomen shows findings consistent with acute on chronic cholecystitis with marked thickening of the gallbladder wall, cholelithiasis and soft tissue stranding in the adjacent peritoneal fat. Surgery has been consulted, they have recommended admission with IV antibiotics and they will evaluate in the morning.  Allergies:  No Known Allergies    Past Medical History  Diagnosis Date  . Hypertension   . Hepatitis C   . Chronic back pain   . Polysubstance abuse     benzos, opiates, cocaine, mariuana    Past Surgical History  Procedure Laterality Date  . Hernia repair      Prior to Admission medications   Medication Sig Start Date End Date Taking? Authorizing Provider  acetaminophen (TYLENOL) 500 MG tablet Take 1,000 mg by mouth every 6 (six) hours as needed for mild pain or moderate pain.   Yes Historical Provider, MD  cyclobenzaprine (FLEXERIL) 5 MG tablet Take 1 tablet (5 mg total) by mouth 3 (three) times daily. Patient not taking: Reported on 04/28/2015 08/30/14   Evalee Jefferson, PA-C  predniSONE (DELTASONE) 10 MG tablet 6, 5, 4, 3, 2 then 1 tablet by mouth daily for 6 days total. Patient not taking: Reported on 04/28/2015 08/30/14   Evalee Jefferson, PA-C    Social History:  reports that he has been smoking Cigarettes.  He has been smoking  about 1.50 packs per day. He does not have any smokeless tobacco history on file. He reports that he uses illicit drugs (Marijuana and Cocaine). He reports that he does not drink alcohol.  History reviewed. No pertinent family history.  Review of Systems:  Constitutional: Denies fever, chills, diaphoresis, appetite change and fatigue.  HEENT: Denies photophobia, eye pain, redness, hearing loss, ear pain, congestion, sore throat, rhinorrhea, sneezing, mouth sores, trouble swallowing, neck pain, neck stiffness and tinnitus.   Respiratory: Denies SOB, DOE, cough, chest tightness,  and wheezing.   Cardiovascular: Denies chest pain, palpitations and leg swelling.  Gastrointestinal: Denies diarrhea, constipation, blood in stool and abdominal distention.  Genitourinary: Denies dysuria, urgency, frequency, hematuria, flank pain and difficulty urinating.  Endocrine: Denies: hot or cold intolerance, sweats, changes in hair or nails, polyuria, polydipsia. Musculoskeletal: Denies myalgias, back pain, joint swelling, arthralgias and gait problem.  Skin: Denies pallor, rash and wound.  Neurological: Denies dizziness, seizures, syncope, weakness, light-headedness, numbness and headaches.  Hematological: Denies adenopathy. Easy bruising, personal or family bleeding history  Psychiatric/Behavioral: Denies suicidal ideation, mood changes, confusion, nervousness, sleep disturbance and agitation   Physical Exam: Blood pressure 116/74, pulse 77, temperature 98.3 F (36.8 C), temperature source Oral, resp. rate 20, height '5\' 6"'  (1.676 m), weight 73.573 kg (162 lb 3.2 oz), SpO2 98 %. General: Alert, awake,  oriented 3 HEENT: Normocephalic, atraumatic, pupils equal and reactive to light, extraocular movements intact, dry mucous membranes, poor dentition. Neck: Supple, no JVD, no lymphadenopathy, no bruits, no goiter. Cardiovascular: Regular rate and rhythm, no murmurs, rubs or gallops. Lungs: Clear to auscultation  bilaterally. Abdomen: Soft, tender to palpation to the right upper and lower quadrant as well as the epigastrium, no rebound or guarding, positive bowel sounds. Extremities: No clubbing, cyanosis or edema, positive pulses. Neurologic: Grossly intact and nonfocal.  Labs on Admission:  Results for orders placed or performed during the hospital encounter of 04/28/15 (from the past 48 hour(s))  Comprehensive metabolic panel     Status: Abnormal   Collection Time: 04/28/15 11:10 AM  Result Value Ref Range   Sodium 135 135 - 145 mmol/L   Potassium 4.2 3.5 - 5.1 mmol/L   Chloride 99 (L) 101 - 111 mmol/L   CO2 27 22 - 32 mmol/L   Glucose, Bld 99 65 - 99 mg/dL   BUN 20 6 - 20 mg/dL   Creatinine, Ser 1.09 0.61 - 1.24 mg/dL   Calcium 9.4 8.9 - 10.3 mg/dL   Total Protein 7.8 6.5 - 8.1 g/dL   Albumin 3.9 3.5 - 5.0 g/dL   AST 24 15 - 41 U/L   ALT 22 17 - 63 U/L   Alkaline Phosphatase 65 38 - 126 U/L   Total Bilirubin 1.3 (H) 0.3 - 1.2 mg/dL   GFR calc non Af Amer >60 >60 mL/min   GFR calc Af Amer >60 >60 mL/min    Comment: (NOTE) The eGFR has been calculated using the CKD EPI equation. This calculation has not been validated in all clinical situations. eGFR's persistently <60 mL/min signify possible Chronic Kidney Disease.    Anion gap 9 5 - 15  Lipase, blood     Status: None   Collection Time: 04/28/15 11:10 AM  Result Value Ref Range   Lipase 27 11 - 51 U/L    Comment: Please note change in reference range.  CBC with Differential     Status: Abnormal   Collection Time: 04/28/15 11:10 AM  Result Value Ref Range   WBC 21.8 (H) 4.0 - 10.5 K/uL   RBC 4.80 4.22 - 5.81 MIL/uL   Hemoglobin 15.5 13.0 - 17.0 g/dL   HCT 45.1 39.0 - 52.0 %   MCV 94.0 78.0 - 100.0 fL   MCH 32.3 26.0 - 34.0 pg   MCHC 34.4 30.0 - 36.0 g/dL   RDW 13.7 11.5 - 15.5 %   Platelets 223 150 - 400 K/uL   Neutrophils Relative % 71 %   Neutro Abs 15.6 (H) 1.7 - 7.7 K/uL   Lymphocytes Relative 15 %   Lymphs Abs 3.2  0.7 - 4.0 K/uL   Monocytes Relative 13 %   Monocytes Absolute 2.8 (H) 0.1 - 1.0 K/uL   Eosinophils Relative 1 %   Eosinophils Absolute 0.2 0.0 - 0.7 K/uL   Basophils Relative 0 %   Basophils Absolute 0.0 0.0 - 0.1 K/uL  Urinalysis, Routine w reflex microscopic     Status: Abnormal   Collection Time: 04/28/15 11:16 AM  Result Value Ref Range   Color, Urine AMBER (A) YELLOW    Comment: BIOCHEMICALS MAY BE AFFECTED BY COLOR   APPearance CLEAR CLEAR   Specific Gravity, Urine >1.030 (H) 1.005 - 1.030   pH 5.5 5.0 - 8.0   Glucose, UA NEGATIVE NEGATIVE mg/dL   Hgb urine dipstick NEGATIVE NEGATIVE   Bilirubin  Urine MODERATE (A) NEGATIVE   Ketones, ur TRACE (A) NEGATIVE mg/dL   Protein, ur 30 (A) NEGATIVE mg/dL   Urobilinogen, UA 4.0 (H) 0.0 - 1.0 mg/dL   Nitrite NEGATIVE NEGATIVE   Leukocytes, UA NEGATIVE NEGATIVE  Urine microscopic-add on     Status: Abnormal   Collection Time: 04/28/15 11:16 AM  Result Value Ref Range   WBC, UA 0-2 <3 WBC/hpf   RBC / HPF 0-2 <3 RBC/hpf   Casts GRANULAR CAST (A) NEGATIVE    Comment: HYALINE CASTS    Radiological Exams on Admission: Ct Abdomen Pelvis W Contrast  04/28/2015  CLINICAL DATA:  Right lower quadrant pain and vomiting. EXAM: CT ABDOMEN AND PELVIS WITH CONTRAST TECHNIQUE: Multidetector CT imaging of the abdomen and pelvis was performed using the standard protocol following bolus administration of intravenous contrast. CONTRAST:  118m OMNIPAQUE IOHEXOL 300 MG/ML  SOLN COMPARISON:  None. FINDINGS: Lower chest: Slight linear scarring or atelectasis at the right lung base. Heart size is normal. Hepatobiliary: There is marked thickening of the gallbladder wall at 8 mm. There are several gallstones, 1 measuring 17 mm and 2 others which are tiny. There is slight soft tissue stranding and edema in the fat around the edematous gallbladder. No dilated bile ducts. Slight hepatomegaly. History of hepatitis-C. No focal liver lesions. Pancreas: Normal. Spleen:  Normal. Adrenals/Urinary Tract: Normal. Stomach/Bowel: The bowel is normal including the terminal ileum and appendix. Vascular/Lymphatic: Minimal calcification in the common iliac arteries. No significant adenopathy. Reproductive: Normal. Other: No free air or free fluid. Musculoskeletal: Multilevel degenerative disc disease in the lumbar spine. No acute abnormality. IMPRESSION: Findings consistent with acute on chronic cholecystitis with marked thickening of the gallbladder wall, cholelithiasis, and soft tissue stranding in the adjacent peritoneal fat. Electronically Signed   By: JLorriane ShireM.D.   On: 04/28/2015 12:18    Assessment/Plan Principal Problem:   Acute on chronic cholecystitis Active Problems:   Leukocytosis   Tobacco abuse   Acute cholecystitis    Acute on chronic cholecystitis with cholelithiasis -We'll admit to a MedSurg bed, start on IV Zosyn. -LFTs are currently within normal limits. -Treat symptomatically with antiemetics and pain medication as needed. -Surgery to assess patient in a.m. for cholecystectomy this admission.  Leukocytosis -Secondary to above. -Follow trend with antibiotics.  Tobacco abuse -Counseled on cessation. -Nicotine patch.  Illicit substance abuse -Counseled on cessation.  DVT prophylaxis -Subcutaneous heparin  CODE STATUS -Full code   Time Spent on Admission: 75 minutes  Gerald Moore,Gerald Moore [facility] Pager: [phone]/07/2014, 3:58 PM

## 2015-04-29 DIAGNOSIS — Z720 Tobacco use: Secondary | ICD-10-CM

## 2015-04-29 DIAGNOSIS — D72829 Elevated white blood cell count, unspecified: Secondary | ICD-10-CM

## 2015-04-29 LAB — HEPATIC FUNCTION PANEL
ALK PHOS: 98 U/L (ref 38–126)
ALT: 28 U/L (ref 17–63)
AST: 31 U/L (ref 15–41)
Albumin: 3.5 g/dL (ref 3.5–5.0)
BILIRUBIN INDIRECT: 1.1 mg/dL — AB (ref 0.3–0.9)
BILIRUBIN TOTAL: 1.7 mg/dL — AB (ref 0.3–1.2)
Bilirubin, Direct: 0.6 mg/dL — ABNORMAL HIGH (ref 0.1–0.5)
Total Protein: 7.2 g/dL (ref 6.5–8.1)

## 2015-04-29 LAB — CBC
HEMATOCRIT: 45.2 % (ref 39.0–52.0)
HEMOGLOBIN: 14.9 g/dL (ref 13.0–17.0)
MCH: 31.7 pg (ref 26.0–34.0)
MCHC: 33 g/dL (ref 30.0–36.0)
MCV: 96.2 fL (ref 78.0–100.0)
Platelets: 211 10*3/uL (ref 150–400)
RBC: 4.7 MIL/uL (ref 4.22–5.81)
RDW: 13.7 % (ref 11.5–15.5)
WBC: 14.1 10*3/uL — ABNORMAL HIGH (ref 4.0–10.5)

## 2015-04-29 MED ORDER — MORPHINE SULFATE (PF) 2 MG/ML IV SOLN
1.0000 mg | INTRAVENOUS | Status: DC | PRN
  Administered 2015-04-29 – 2015-04-30 (×5): 2 mg via INTRAVENOUS
  Filled 2015-04-29 (×5): qty 1

## 2015-04-29 MED ORDER — ENSURE ENLIVE PO LIQD
237.0000 mL | Freq: Two times a day (BID) | ORAL | Status: DC
  Administered 2015-05-01: 237 mL via ORAL

## 2015-04-29 NOTE — Progress Notes (Signed)
[facility] PROGRESS NOTE  Gerald CurbRickie J Moore WUJ:[id number] DOB: 21-Oct-1970 DOA: 04/28/2015 PCP: No PCP Per Patient  Assessment/Plan: Acute on Chronic Cholecystitis with Cholelithiasis -Continue Zosyn. -Dr. Lovell SheehanJenkins to evaluate today for potential cholecystectomy in am. -Still with significant pain. Will increase morphine dose.  Leukocytosis -Improved on abx.  Tobacco Abuse -nicotine patch. -Counseled on cessation.  Polysubstance Abuse -Counseled on cessation. -UDS negative.  Code Status: Full Code  Family Communication: Patient only  Disposition Plan: home once medically ready   Consultants:  Surgery   Antibiotics:  Zosyn   Subjective: Still with right-sided abdominal pain  Objective: Filed Vitals:   04/28/15 1400 04/28/15 1417 04/28/15 2134 04/29/15 0529  BP: 116/74  121/82 156/94  Pulse: 77  74 84  Temp:   99.4 F (37.4 C) 99.6 F (37.6 C)  TempSrc:   Oral Oral  Resp:   18 18  Height:  5\' 6"  (1.676 m)    Weight:  73.573 kg (162 lb 3.2 oz)    SpO2: 98%  98% 97%    Intake/Output Summary (Last 24 hours) at 04/29/15 1055 Last data filed at 04/28/15 1700  Gross per 24 hour  Intake    240 ml  Output      0 ml  Net    240 ml   Filed Weights   04/28/15 1033 04/28/15 1417  Weight: 73.483 kg (162 lb) 73.573 kg (162 lb 3.2 oz)    Exam:   General:  AA Ox3  Cardiovascular: RRR  Respiratory: CTA B  Abdomen: S/ND/+BS/no rebound or guarding  Extremities: no C/C/E   Neurologic:  Non-focal  Data Reviewed: Basic Metabolic Panel:  Recent Labs Lab 04/28/15 1110  NA 135  K 4.2  CL 99*  CO2 27  GLUCOSE 99  BUN 20  CREATININE 1.09  CALCIUM 9.4   Liver Function Tests:  Recent Labs Lab 04/28/15 1110 04/29/15 0600  AST 24 31  ALT 22 28  ALKPHOS 65 98  BILITOT 1.3* 1.7*  PROT 7.8 7.2  ALBUMIN 3.9 3.5    Recent Labs Lab 04/28/15 1110  LIPASE 27   No results for input(s): AMMONIA in the last 168 hours. CBC:  Recent  Labs Lab 04/28/15 1110 04/29/15 0601  WBC 21.8* 14.1*  NEUTROABS 15.6*  --   HGB 15.5 14.9  HCT 45.1 45.2  MCV 94.0 96.2  PLT 223 211   Cardiac Enzymes: No results for input(s): CKTOTAL, CKMB, CKMBINDEX, TROPONINI in the last 168 hours. BNP (last 3 results) No results for input(s): BNP in the last 8760 hours.  ProBNP (last 3 results) No results for input(s): PROBNP in the last 8760 hours.  CBG: No results for input(s): GLUCAP in the last 168 hours.  No results found for this or any previous visit (from the past 240 hour(s)).   Studies: Ct Abdomen Pelvis W Contrast  04/28/2015  CLINICAL DATA:  Right lower quadrant pain and vomiting. EXAM: CT ABDOMEN AND PELVIS WITH CONTRAST TECHNIQUE: Multidetector CT imaging of the abdomen and pelvis was performed using the standard protocol following bolus administration of intravenous contrast. CONTRAST:  100mL OMNIPAQUE IOHEXOL 300 MG/ML  SOLN COMPARISON:  None. FINDINGS: Lower chest: Slight linear scarring or atelectasis at the right lung base. Heart size is normal. Hepatobiliary: There is marked thickening of the gallbladder wall at 8 mm. There are several gallstones, 1 measuring 17 mm and 2 others which are tiny. There is slight soft tissue stranding and edema in the fat around the  edematous gallbladder. No dilated bile ducts. Slight hepatomegaly. History of hepatitis-C. No focal liver lesions. Pancreas: Normal. Spleen: Normal. Adrenals/Urinary Tract: Normal. Stomach/Bowel: The bowel is normal including the terminal ileum and appendix. Vascular/Lymphatic: Minimal calcification in the common iliac arteries. No significant adenopathy. Reproductive: Normal. Other: No free air or free fluid. Musculoskeletal: Multilevel degenerative disc disease in the lumbar spine. No acute abnormality. IMPRESSION: Findings consistent with acute on chronic cholecystitis with marked thickening of the gallbladder wall, cholelithiasis, and soft tissue stranding in the  adjacent peritoneal fat. Electronically Signed   By: Francene Boyers M.D.   On: 04/28/2015 12:18    Scheduled Meds: . heparin  5,000 Units Subcutaneous 3 times per day  . nicotine  14 mg Transdermal Daily  . piperacillin-tazobactam (ZOSYN)  IV  3.375 g Intravenous Q8H   Continuous Infusions: . sodium chloride 75 mL/hr at 04/28/15 2041    Principal Problem:   Acute on chronic cholecystitis Active Problems:   Leukocytosis   Tobacco abuse   Acute cholecystitis    Time spent: 25 minutes. Greater than 50% of this time was spent in direct contact with the patient coordinating care.    Chaya Jan  [facility] Pager [phone]  If 7PM-7AM, please contact night-coverage at [email or web address], password [facility] 04/29/2015, 10:55 AM

## 2015-04-29 NOTE — Progress Notes (Signed)
Initial Nutrition Assessment  DOCUMENTATION CODES:  Not applicable  INTERVENTION:  Ensure Enlive po BID, each supplement provides 350 kcal and 20 grams of protein post op for wound healing  Took meal/food preferences.  NUTRITION DIAGNOSIS:  Inadequate oral intake related to vomiting, nausea, acute illness as evidenced by energy intake < or equal to 50% for > or equal to 5 days.  GOAL:  Patient will meet greater than or equal to 90% of their needs  MONITOR:  PO intake, Supplement acceptance, Diet advancement, Labs  REASON FOR ASSESSMENT:  Malnutrition Screening Tool    ASSESSMENT:  44 y/o male PMHx hepatitis C and polysubstance abuse who presents with right upper quadrant abdominal pain, nausea, and vomiting. CT scan of the abdomen revealed cholecystitis with cholelithiasis.  Pt reports a very poor intake for the last 5 Days. He starting having emesis on Saturday and it continued until Monday. He has had severe abdominal pain. He Denies c/d though today was the first time he had a BM since his symptoms started. He does not follow any type of diet at home.    RECENTLY, his weight has been stable and he reports no significant changes in the last couple months.  Pt is unaware of the etiology of his long term wt loss. He states that he has no idea why he lost so much weight. He says he eats frequently and has had no changes in appetite or chronic symptoms such as n/v/c/d. The weight loss has worried him about cancer. He states his usual BW used to be 200-210 lbs. It is possible that is baseline REE has risen due to chronic hep C.   Took meal prefs. Pt agreeable to EE Post op to promote wound healing.   NFPE: WDL  Diet Order:  DIET SOFT Room service appropriate?: Yes; Fluid consistency:: Thin  Skin:  Reviewed, no issues  Last BM:  10/29 (until today)  Height:  Ht Readings from Last 1 Encounters:  04/28/15 5\' 6"  (1.676 m)   Weight:  Wt Readings from Last 1 Encounters:   04/28/15 162 lb 3.2 oz (73.573 kg)   Wt Readings from Last 10 Encounters:  04/28/15 162 lb 3.2 oz (73.573 kg)  08/30/14 192 lb (87.091 kg)  06/05/14 187 lb 1.6 oz (84.868 kg)  01/28/14 190 lb (86.183 kg)  05/12/13 190 lb (86.183 kg)  01/20/13 190 lb (86.183 kg)  05/12/12 190 lb 14.4 oz (86.592 kg)   Ideal Body Weight:  64.55 kg  BMI:  Body mass index is 26.19 kg/(m^2).  Estimated Nutritional Needs:  Kcal:  1700-2000 (23-27 kcal/kg) Protein:  74-96g (1-1.3 g/kg bw) Fluid:  1.4-2 liters fluid  EDUCATION NEEDS:  No education needs identified at this time  Christophe Louisathan Franks RD, LDN Nutrition Pager: [phone] 04/29/2015 4:06 PM

## 2015-04-29 NOTE — Care Management Note (Addendum)
Case Management Note  Patient Details  Name: Gerald Moore MRN: [id number] Date of Birth: [birth year]  Subjective/Objective:                  Pt admitted with cholecystitis. Pt having lap chole on 11/4. Pt lives at home with spouse and is ind at baseline. Pt has no insurance and no PCP. Pt will f/u with surgery. [facility]  Action/Plan: [facility] and given info on [facility] at his [facility]. [facility]. Will cont to follow.   Expected Discharge Date:   04/30/2015               Expected Discharge Plan:  Home/Self Care  In-House Referral:  Financial Counselor  Discharge planning Services  [facility]  Post Acute Care Choice:  NA Choice offered to:  NA  DME Arranged:    DME Agency:     HH Arranged:    HH Agency:     Status of Service:  In process, will continue to follow  Medicare Important Message Given:    Date Medicare IM Given:    Medicare IM give by:    Date Additional Medicare IM Given:    Additional Medicare Important Message give by:     If discussed at Long Length of Stay Meetings, dates discussed:    Additional Comments:  Malcolm MetroChildress, Dillard Pascal Demske, RN 04/29/2015, 3:28 PM

## 2015-04-29 NOTE — Consult Note (Signed)
Reason for Consult: Cholecystitis, cholelithiasis Referring Physician: Hospitalist  Gerald Moore is an 44 y.o. male.  HPI: Patient is a 44 year old white male with history of hepatitis C and polysubstance abuse who presents with right upper quadrant abdominal pain, nausea, and vomiting. He states he has had this in the past. CT scan of the abdomen revealed cholecystitis with cholelithiasis. He also has slightly enlarged liver, consistent with his history of hepatitis C. This has not been treated and he was told that he had years ago.  Past Medical History  Diagnosis Date  . Hypertension   . Hepatitis C   . Chronic back pain   . Polysubstance abuse     benzos, opiates, cocaine, mariuana    Past Surgical History  Procedure Laterality Date  . Hernia repair      History reviewed. No pertinent family history.  Social History:  reports that he has been smoking Cigarettes.  He has been smoking about 1.50 packs per day. He does not have any smokeless tobacco history on file. He reports that he uses illicit drugs (Marijuana and Cocaine). He reports that he does not drink alcohol.  Allergies: No Known Allergies  Medications: I have reviewed the patient's current medications.  Results for orders placed or performed during the hospital encounter of 04/28/15 (from the past 48 hour(s))  Comprehensive metabolic panel     Status: Abnormal   Collection Time: 04/28/15 11:10 AM  Result Value Ref Range   Sodium 135 135 - 145 mmol/L   Potassium 4.2 3.5 - 5.1 mmol/L   Chloride 99 (L) 101 - 111 mmol/L   CO2 27 22 - 32 mmol/L   Glucose, Bld 99 65 - 99 mg/dL   BUN 20 6 - 20 mg/dL   Creatinine, Ser 1.09 0.61 - 1.24 mg/dL   Calcium 9.4 8.9 - 10.3 mg/dL   Total Protein 7.8 6.5 - 8.1 g/dL   Albumin 3.9 3.5 - 5.0 g/dL   AST 24 15 - 41 U/L   ALT 22 17 - 63 U/L   Alkaline Phosphatase 65 38 - 126 U/L   Total Bilirubin 1.3 (H) 0.3 - 1.2 mg/dL   GFR calc non Af Amer >60 >60 mL/min   GFR calc Af Amer  >60 >60 mL/min    Comment: (NOTE) The eGFR has been calculated using the CKD EPI equation. This calculation has not been validated in all clinical situations. eGFR's persistently <60 mL/min signify possible Chronic Kidney Disease.    Anion gap 9 5 - 15  Lipase, blood     Status: None   Collection Time: 04/28/15 11:10 AM  Result Value Ref Range   Lipase 27 11 - 51 U/L    Comment: Please note change in reference range.  CBC with Differential     Status: Abnormal   Collection Time: 04/28/15 11:10 AM  Result Value Ref Range   WBC 21.8 (H) 4.0 - 10.5 K/uL   RBC 4.80 4.22 - 5.81 MIL/uL   Hemoglobin 15.5 13.0 - 17.0 g/dL   HCT 45.1 39.0 - 52.0 %   MCV 94.0 78.0 - 100.0 fL   MCH 32.3 26.0 - 34.0 pg   MCHC 34.4 30.0 - 36.0 g/dL   RDW 13.7 11.5 - 15.5 %   Platelets 223 150 - 400 K/uL   Neutrophils Relative % 71 %   Neutro Abs 15.6 (H) 1.7 - 7.7 K/uL   Lymphocytes Relative 15 %   Lymphs Abs 3.2 0.7 - 4.0 K/uL  Monocytes Relative 13 %   Monocytes Absolute 2.8 (H) 0.1 - 1.0 K/uL   Eosinophils Relative 1 %   Eosinophils Absolute 0.2 0.0 - 0.7 K/uL   Basophils Relative 0 %   Basophils Absolute 0.0 0.0 - 0.1 K/uL  Urinalysis, Routine w reflex microscopic     Status: Abnormal   Collection Time: 04/28/15 11:16 AM  Result Value Ref Range   Color, Urine AMBER (A) YELLOW    Comment: BIOCHEMICALS MAY BE AFFECTED BY COLOR   APPearance CLEAR CLEAR   Specific Gravity, Urine >1.030 (H) 1.005 - 1.030   pH 5.5 5.0 - 8.0   Glucose, UA NEGATIVE NEGATIVE mg/dL   Hgb urine dipstick NEGATIVE NEGATIVE   Bilirubin Urine MODERATE (A) NEGATIVE   Ketones, ur TRACE (A) NEGATIVE mg/dL   Protein, ur 30 (A) NEGATIVE mg/dL   Urobilinogen, UA 4.0 (H) 0.0 - 1.0 mg/dL   Nitrite NEGATIVE NEGATIVE   Leukocytes, UA NEGATIVE NEGATIVE  Urine microscopic-add on     Status: Abnormal   Collection Time: 04/28/15 11:16 AM  Result Value Ref Range   WBC, UA 0-2 <3 WBC/hpf   RBC / HPF 0-2 <3 RBC/hpf   Casts GRANULAR  CAST (A) NEGATIVE    Comment: HYALINE CASTS  Urine rapid drug screen (hosp performed)     Status: Abnormal   Collection Time: 04/28/15 10:30 PM  Result Value Ref Range   Opiates POSITIVE (A) NONE DETECTED   Cocaine NONE DETECTED NONE DETECTED   Benzodiazepines NONE DETECTED NONE DETECTED   Amphetamines NONE DETECTED NONE DETECTED   Tetrahydrocannabinol NONE DETECTED NONE DETECTED   Barbiturates NONE DETECTED NONE DETECTED    Comment:        DRUG SCREEN FOR MEDICAL PURPOSES ONLY.  IF CONFIRMATION IS NEEDED FOR ANY PURPOSE, NOTIFY LAB WITHIN 5 DAYS.        LOWEST DETECTABLE LIMITS FOR URINE DRUG SCREEN Drug Class       Cutoff (ng/mL) Amphetamine      1000 Barbiturate      200 Benzodiazepine   194 Tricyclics       174 Opiates          300 Cocaine          300 THC              50   Hepatic function panel     Status: Abnormal   Collection Time: 04/29/15  6:00 AM  Result Value Ref Range   Total Protein 7.2 6.5 - 8.1 g/dL   Albumin 3.5 3.5 - 5.0 g/dL   AST 31 15 - 41 U/L   ALT 28 17 - 63 U/L   Alkaline Phosphatase 98 38 - 126 U/L   Total Bilirubin 1.7 (H) 0.3 - 1.2 mg/dL   Bilirubin, Direct 0.6 (H) 0.1 - 0.5 mg/dL   Indirect Bilirubin 1.1 (H) 0.3 - 0.9 mg/dL  CBC     Status: Abnormal   Collection Time: 04/29/15  6:01 AM  Result Value Ref Range   WBC 14.1 (H) 4.0 - 10.5 K/uL   RBC 4.70 4.22 - 5.81 MIL/uL   Hemoglobin 14.9 13.0 - 17.0 g/dL   HCT 45.2 39.0 - 52.0 %   MCV 96.2 78.0 - 100.0 fL   MCH 31.7 26.0 - 34.0 pg   MCHC 33.0 30.0 - 36.0 g/dL   RDW 13.7 11.5 - 15.5 %   Platelets 211 150 - 400 K/uL    Ct Abdomen Pelvis W Contrast  04/28/2015  CLINICAL DATA:  Right lower quadrant pain and vomiting. EXAM: CT ABDOMEN AND PELVIS WITH CONTRAST TECHNIQUE: Multidetector CT imaging of the abdomen and pelvis was performed using the standard protocol following bolus administration of intravenous contrast. CONTRAST:  144m OMNIPAQUE IOHEXOL 300 MG/ML  SOLN COMPARISON:  None.  FINDINGS: Lower chest: Slight linear scarring or atelectasis at the right lung base. Heart size is normal. Hepatobiliary: There is marked thickening of the gallbladder wall at 8 mm. There are several gallstones, 1 measuring 17 mm and 2 others which are tiny. There is slight soft tissue stranding and edema in the fat around the edematous gallbladder. No dilated bile ducts. Slight hepatomegaly. History of hepatitis-C. No focal liver lesions. Pancreas: Normal. Spleen: Normal. Adrenals/Urinary Tract: Normal. Stomach/Bowel: The bowel is normal including the terminal ileum and appendix. Vascular/Lymphatic: Minimal calcification in the common iliac arteries. No significant adenopathy. Reproductive: Normal. Other: No free air or free fluid. Musculoskeletal: Multilevel degenerative disc disease in the lumbar spine. No acute abnormality. IMPRESSION: Findings consistent with acute on chronic cholecystitis with marked thickening of the gallbladder wall, cholelithiasis, and soft tissue stranding in the adjacent peritoneal fat. Electronically Signed   By: JLorriane ShireM.D.   On: 04/28/2015 12:18    ROS: See chart Blood pressure 156/94, pulse 84, temperature 99.6 F (37.6 C), temperature source Oral, resp. rate 18, height '5\' 6"'  (1.676 m), weight 73.573 kg (162 lb 3.2 oz), SpO2 97 %. Physical Exam: Pleasant white male distress. Abdomen is soft with some tenderness to palpation in the right upper quadrant. No rigidity is noted. I did not palpate the liver edge during this examination secondary to his tenderness.  Assessment/Plan: Impression: Cholecystitis, cholelithiasis Plan: We'll proceed with laparoscopic cholecystectomy with possible cholangiograms tomorrow. The risks and benefits of procedure including bleeding, infection, hepatobiliary injury, and the possibility of an open procedure were fully explained to the patient, who gave informed consent.  Madeleyn Schwimmer A 04/29/2015, 12:46 PM

## 2015-04-30 ENCOUNTER — Inpatient Hospital Stay (HOSPITAL_COMMUNITY): Payer: Self-pay | Admitting: Anesthesiology

## 2015-04-30 ENCOUNTER — Encounter (HOSPITAL_COMMUNITY): Payer: Self-pay | Admitting: Anesthesiology

## 2015-04-30 ENCOUNTER — Encounter (HOSPITAL_COMMUNITY)
Admission: EM | Disposition: A | Discharge status: Home / Self Care | Payer: Self-pay | Source: Home / Self Care | Attending: Internal Medicine

## 2015-04-30 HISTORY — PX: CHOLECYSTECTOMY: SHX55

## 2015-04-30 LAB — CBC
HEMATOCRIT: 42.2 % (ref 39.0–52.0)
HEMOGLOBIN: 14.1 g/dL (ref 13.0–17.0)
MCH: 31.9 pg (ref 26.0–34.0)
MCHC: 33.4 g/dL (ref 30.0–36.0)
MCV: 95.5 fL (ref 78.0–100.0)
Platelets: 254 10*3/uL (ref 150–400)
RBC: 4.42 MIL/uL (ref 4.22–5.81)
RDW: 13.4 % (ref 11.5–15.5)
WBC: 9.7 10*3/uL (ref 4.0–10.5)

## 2015-04-30 LAB — COMPREHENSIVE METABOLIC PANEL
ALBUMIN: 3.3 g/dL — AB (ref 3.5–5.0)
ALK PHOS: 77 U/L (ref 38–126)
ALT: 22 U/L (ref 17–63)
ANION GAP: 6 (ref 5–15)
AST: 19 U/L (ref 15–41)
BUN: 12 mg/dL (ref 6–20)
CALCIUM: 9.2 mg/dL (ref 8.9–10.3)
CO2: 28 mmol/L (ref 22–32)
Chloride: 105 mmol/L (ref 101–111)
Creatinine, Ser: 0.85 mg/dL (ref 0.61–1.24)
GFR calc non Af Amer: >60 mL/min (ref >60)
GLUCOSE: 106 mg/dL — AB (ref 65–99)
POTASSIUM: 4.2 mmol/L (ref 3.5–5.1)
Sodium: 139 mmol/L (ref 135–145)
Total Bilirubin: 1.1 mg/dL (ref 0.3–1.2)
Total Protein: 7 g/dL (ref 6.5–8.1)

## 2015-04-30 LAB — SURGICAL PCR SCREEN
MRSA, PCR: NEGATIVE
STAPHYLOCOCCUS AUREUS: NEGATIVE

## 2015-04-30 SURGERY — LAPAROSCOPIC CHOLECYSTECTOMY
Anesthesia: General

## 2015-04-30 MED ORDER — ROCURONIUM BROMIDE 100 MG/10ML IV SOLN
INTRAVENOUS | Status: DC | PRN
  Administered 2015-04-30: 5 mg via INTRAVENOUS
  Administered 2015-04-30: 20 mg via INTRAVENOUS

## 2015-04-30 MED ORDER — LIDOCAINE HCL (CARDIAC) 20 MG/ML IV SOLN
INTRAVENOUS | Status: DC | PRN
  Administered 2015-04-30: 50 mg via INTRAVENOUS

## 2015-04-30 MED ORDER — BUPIVACAINE HCL (PF) 0.5 % IJ SOLN
INTRAMUSCULAR | Status: AC
Start: 2015-04-30 — End: 2015-04-30
  Filled 2015-04-30: qty 30

## 2015-04-30 MED ORDER — LORAZEPAM 2 MG/ML IJ SOLN
2.0000 mg | INTRAMUSCULAR | Status: DC | PRN
  Administered 2015-04-30: 2 mg via INTRAVENOUS
  Filled 2015-04-30: qty 1

## 2015-04-30 MED ORDER — ACETAMINOPHEN 650 MG RE SUPP: 650.0000 mg | Freq: Four times a day (QID) | RECTAL | Status: DC | PRN

## 2015-04-30 MED ORDER — DIPHENHYDRAMINE HCL 50 MG/ML IJ SOLN: 12.5000 mg | Freq: Four times a day (QID) | INTRAMUSCULAR | Status: DC | PRN

## 2015-04-30 MED ORDER — BUPIVACAINE HCL 0.5 % IJ SOLN
INTRAMUSCULAR | Status: DC | PRN
  Administered 2015-04-30: 10 mL

## 2015-04-30 MED ORDER — NEOSTIGMINE METHYLSULFATE 10 MG/10ML IV SOLN
INTRAVENOUS | Status: DC | PRN
  Administered 2015-04-30: 3 mg via INTRAVENOUS

## 2015-04-30 MED ORDER — FENTANYL CITRATE (PF) 250 MCG/5ML IJ SOLN
INTRAMUSCULAR | Status: DC | PRN
  Administered 2015-04-30: 100 ug via INTRAVENOUS
  Administered 2015-04-30 (×8): 50 ug via INTRAVENOUS

## 2015-04-30 MED ORDER — POVIDONE-IODINE 10 % EX OINT
TOPICAL_OINTMENT | CUTANEOUS | Status: AC
  Filled 2015-04-30: qty 1

## 2015-04-30 MED ORDER — SIMETHICONE 80 MG PO CHEW
40.0000 mg | CHEWABLE_TABLET | Freq: Four times a day (QID) | ORAL | Status: DC | PRN
  Administered 2015-04-30: 40 mg via ORAL
  Filled 2015-04-30: qty 1

## 2015-04-30 MED ORDER — KETOROLAC TROMETHAMINE 30 MG/ML IJ SOLN
30.0000 mg | Freq: Once | INTRAMUSCULAR | Status: AC
  Administered 2015-04-30: 30 mg via INTRAVENOUS
  Filled 2015-04-30: qty 1

## 2015-04-30 MED ORDER — GLYCOPYRROLATE 0.2 MG/ML IJ SOLN
INTRAMUSCULAR | Status: DC | PRN
  Administered 2015-04-30: .6 mg via INTRAVENOUS

## 2015-04-30 MED ORDER — HEMOSTATIC AGENTS (NO CHARGE) OPTIME
TOPICAL | Status: DC | PRN
  Administered 2015-04-30: 2 via TOPICAL

## 2015-04-30 MED ORDER — LABETALOL HCL 5 MG/ML IV SOLN
10.0000 mg | Freq: Once | INTRAVENOUS | Status: AC
  Administered 2015-04-30: 10 mg via INTRAVENOUS

## 2015-04-30 MED ORDER — ONDANSETRON HCL 4 MG/2ML IJ SOLN: 4.0000 mg | Freq: Once | INTRAMUSCULAR | Status: DC | PRN

## 2015-04-30 MED ORDER — HYDRALAZINE HCL 20 MG/ML IJ SOLN
10.0000 mg | Freq: Four times a day (QID) | INTRAMUSCULAR | Status: DC | PRN
  Administered 2015-05-01: 10 mg via INTRAVENOUS
  Filled 2015-04-30: qty 1

## 2015-04-30 MED ORDER — FENTANYL CITRATE (PF) 250 MCG/5ML IJ SOLN
INTRAMUSCULAR | Status: AC
  Filled 2015-04-30: qty 25

## 2015-04-30 MED ORDER — PROPOFOL 10 MG/ML IV BOLUS
INTRAVENOUS | Status: AC
  Filled 2015-04-30: qty 20

## 2015-04-30 MED ORDER — GLYCOPYRROLATE 0.2 MG/ML IJ SOLN
INTRAMUSCULAR | Status: AC
  Filled 2015-04-30: qty 3

## 2015-04-30 MED ORDER — ONDANSETRON HCL 4 MG/2ML IJ SOLN
INTRAMUSCULAR | Status: AC
  Filled 2015-04-30: qty 2

## 2015-04-30 MED ORDER — FENTANYL CITRATE (PF) 100 MCG/2ML IJ SOLN
INTRAMUSCULAR | Status: AC
  Filled 2015-04-30: qty 4

## 2015-04-30 MED ORDER — NEOSTIGMINE METHYLSULFATE 10 MG/10ML IV SOLN
INTRAVENOUS | Status: AC
Start: 2015-04-30 — End: 2015-04-30
  Filled 2015-04-30: qty 1

## 2015-04-30 MED ORDER — FENTANYL CITRATE (PF) 100 MCG/2ML IJ SOLN
25.0000 ug | INTRAMUSCULAR | Status: DC | PRN
  Administered 2015-04-30 (×4): 50 ug via INTRAVENOUS
  Filled 2015-04-30 (×2): qty 2

## 2015-04-30 MED ORDER — OXYCODONE HCL 5 MG PO TABS
10.0000 mg | ORAL_TABLET | ORAL | Status: DC | PRN
Start: 2015-04-30 — End: 2015-05-01
  Administered 2015-04-30 – 2015-05-01 (×3): 10 mg via ORAL
  Filled 2015-04-30 (×3): qty 2

## 2015-04-30 MED ORDER — ONDANSETRON HCL 4 MG/2ML IJ SOLN
4.0000 mg | Freq: Once | INTRAMUSCULAR | Status: AC
  Administered 2015-04-30: 4 mg via INTRAVENOUS

## 2015-04-30 MED ORDER — LIDOCAINE HCL (PF) 1 % IJ SOLN
INTRAMUSCULAR | Status: AC
  Filled 2015-04-30: qty 5

## 2015-04-30 MED ORDER — MIDAZOLAM HCL 2 MG/2ML IJ SOLN
INTRAMUSCULAR | Status: AC
  Filled 2015-04-30: qty 2

## 2015-04-30 MED ORDER — DIPHENHYDRAMINE HCL 12.5 MG/5ML PO ELIX: 12.5000 mg | ORAL_SOLUTION | Freq: Four times a day (QID) | ORAL | Status: DC | PRN

## 2015-04-30 MED ORDER — LABETALOL HCL 5 MG/ML IV SOLN
10.0000 mg | Freq: Once | INTRAVENOUS | Status: AC
  Administered 2015-04-30: 10 mg via INTRAVENOUS
  Filled 2015-04-30: qty 4

## 2015-04-30 MED ORDER — ACETAMINOPHEN 325 MG PO TABS: 650.0000 mg | ORAL_TABLET | Freq: Four times a day (QID) | ORAL | Status: DC | PRN

## 2015-04-30 MED ORDER — SUCCINYLCHOLINE CHLORIDE 20 MG/ML IJ SOLN
INTRAMUSCULAR | Status: AC
  Filled 2015-04-30: qty 1

## 2015-04-30 MED ORDER — MIDAZOLAM HCL 2 MG/2ML IJ SOLN
1.0000 mg | INTRAMUSCULAR | Status: DC | PRN
  Administered 2015-04-30: 2 mg via INTRAVENOUS

## 2015-04-30 MED ORDER — LACTATED RINGERS IV SOLN
INTRAVENOUS | Status: DC
  Administered 2015-04-30: 10:00:00 via INTRAVENOUS
  Administered 2015-04-30: 1000 mL via INTRAVENOUS

## 2015-04-30 MED ORDER — CHLORHEXIDINE GLUCONATE 4 % EX LIQD
1.0000 "application " | Freq: Once | CUTANEOUS | Status: AC
  Administered 2015-04-30: 1 via TOPICAL
  Filled 2015-04-30: qty 15

## 2015-04-30 MED ORDER — ROCURONIUM BROMIDE 50 MG/5ML IV SOLN
INTRAVENOUS | Status: AC
  Filled 2015-04-30: qty 1

## 2015-04-30 MED ORDER — SUCCINYLCHOLINE CHLORIDE 20 MG/ML IJ SOLN
INTRAMUSCULAR | Status: DC | PRN
  Administered 2015-04-30: 100 mg via INTRAVENOUS

## 2015-04-30 MED ORDER — HYDROMORPHONE HCL 1 MG/ML IJ SOLN
1.0000 mg | INTRAMUSCULAR | Status: DC | PRN
  Administered 2015-04-30 – 2015-05-01 (×9): 1 mg via INTRAVENOUS
  Filled 2015-04-30 (×9): qty 1

## 2015-04-30 MED ORDER — 0.9 % SODIUM CHLORIDE (POUR BTL) OPTIME
TOPICAL | Status: DC | PRN
  Administered 2015-04-30: 1000 mL

## 2015-04-30 MED ORDER — PROPOFOL 10 MG/ML IV BOLUS
INTRAVENOUS | Status: DC | PRN
  Administered 2015-04-30: 200 mg via INTRAVENOUS

## 2015-04-30 MED ORDER — ENOXAPARIN SODIUM 40 MG/0.4ML ~~LOC~~ SOLN
40.0000 mg | SUBCUTANEOUS | Status: DC
  Administered 2015-05-01: 40 mg via SUBCUTANEOUS
  Filled 2015-04-30: qty 0.4

## 2015-04-30 SURGICAL SUPPLY — 49 items
APPLIER CLIP LAPSCP 10X32 DD (CLIP) ×2 IMPLANT
BAG HAMPER (MISCELLANEOUS) ×2 IMPLANT
CATH CHOLANGIOGRAM 4.5FR (CATHETERS) IMPLANT
CHLORAPREP W/TINT 26ML (MISCELLANEOUS) ×2 IMPLANT
CLOTH BEACON ORANGE TIMEOUT ST (SAFETY) ×2 IMPLANT
COVER LIGHT HANDLE STERIS (MISCELLANEOUS) ×4 IMPLANT
COVER MAYO STAND XLG (DRAPE) IMPLANT
CUTTER LINEAR ENDO 35 ART THIN (STAPLE) ×2 IMPLANT
DECANTER SPIKE VIAL GLASS SM (MISCELLANEOUS) ×2 IMPLANT
DISSECTOR BLUNT TIP ENDO 5MM (MISCELLANEOUS) ×2 IMPLANT
DRAPE C-ARM FOLDED MOBILE STRL (DRAPES) ×2 IMPLANT
ELECT REM PT RETURN 9FT ADLT (ELECTROSURGICAL) ×2
ELECTRODE REM PT RTRN 9FT ADLT (ELECTROSURGICAL) ×1 IMPLANT
FILTER SMOKE EVAC LAPAROSHD (FILTER) ×2 IMPLANT
FORMALIN 10 PREFIL 120ML (MISCELLANEOUS) ×2 IMPLANT
GLOVE ECLIPSE 6.5 STRL STRAW (GLOVE) ×4 IMPLANT
GLOVE INDICATOR 7.0 STRL GRN (GLOVE) ×4 IMPLANT
GLOVE SURG SS PI 7.5 STRL IVOR (GLOVE) ×4 IMPLANT
GOWN STRL REUS W/ TWL LRG LVL3 (GOWN DISPOSABLE) ×2 IMPLANT
GOWN STRL REUS W/ TWL XL LVL3 (GOWN DISPOSABLE) ×1 IMPLANT
GOWN STRL REUS W/TWL LRG LVL3 (GOWN DISPOSABLE) ×8 IMPLANT
GOWN STRL REUS W/TWL XL LVL3 (GOWN DISPOSABLE) ×1
HEMOSTAT SNOW SURGICEL 2X4 (HEMOSTASIS) ×4 IMPLANT
INST SET LAPROSCOPIC AP (KITS) ×2 IMPLANT
KIT ROOM TURNOVER APOR (KITS) ×2 IMPLANT
MANIFOLD NEPTUNE II (INSTRUMENTS) ×2 IMPLANT
NEEDLE INSUFFLATION 120MM (ENDOMECHANICALS) ×2 IMPLANT
NS IRRIG 1000ML POUR BTL (IV SOLUTION) ×2 IMPLANT
PACK LAP CHOLE LZT030E (CUSTOM PROCEDURE TRAY) ×2 IMPLANT
PAD ARMBOARD 7.5X6 YLW CONV (MISCELLANEOUS) ×2 IMPLANT
PENCIL HANDSWITCHING (ELECTRODE) ×2 IMPLANT
POUCH SPECIMEN RETRIEVAL 10MM (ENDOMECHANICALS) ×2 IMPLANT
SET BASIN LINEN APH (SET/KITS/TRAYS/PACK) ×2 IMPLANT
SET TUBE IRRIG SUCTION NO TIP (IRRIGATION / IRRIGATOR) IMPLANT
SLEEVE ENDOPATH XCEL 5M (ENDOMECHANICALS) ×2 IMPLANT
SPONGE GAUZE 2X2 8PLY STRL LF (GAUZE/BANDAGES/DRESSINGS) ×8 IMPLANT
SPONGE SURGIFOAM ABS GEL 100 (HEMOSTASIS) ×2 IMPLANT
STAPLER VISISTAT (STAPLE) ×2 IMPLANT
SUT VICRYL 0 UR6 27IN ABS (SUTURE) ×4 IMPLANT
SYR 20CC LL (SYRINGE) ×2 IMPLANT
SYR 30ML LL (SYRINGE) ×2 IMPLANT
SYR CONTROL 10ML LL (SYRINGE) ×2 IMPLANT
TAPE CLOTH SURG 4X10 WHT LF (GAUZE/BANDAGES/DRESSINGS) ×2 IMPLANT
TROCAR ENDO BLADELESS 11MM (ENDOMECHANICALS) ×2 IMPLANT
TROCAR XCEL NON-BLD 5MMX100MML (ENDOMECHANICALS) ×2 IMPLANT
TROCAR XCEL UNIV SLVE 11M 100M (ENDOMECHANICALS) ×2 IMPLANT
TUBING INSUFFLATION (TUBING) ×2 IMPLANT
WARMER LAPAROSCOPE (MISCELLANEOUS) ×2 IMPLANT
YANKAUER SUCT 12FT TUBE ARGYLE (SUCTIONS) ×2 IMPLANT

## 2015-04-30 NOTE — Anesthesia Preprocedure Evaluation (Addendum)
Anesthesia Evaluation  Patient identified by MRN, date of birth, ID band Patient awake    Reviewed: Allergy & Precautions, NPO status , Patient's Chart, lab work & pertinent test results  Airway Mallampati: I  TM Distance: >3 FB     Dental  (+) Poor Dentition, Dental Advisory Given   Pulmonary Current Smoker,    breath sounds clear to auscultation       Cardiovascular hypertension, Pt. on medications  Rhythm:Regular Rate:Normal     Neuro/Psych    GI/Hepatic GERD  Medicated,(+)     substance abuse  cocaine use and marijuana use, Hepatitis -, C  Endo/Other    Renal/GU      Musculoskeletal   Abdominal   Peds  Hematology   Anesthesia Other Findings   Reproductive/Obstetrics                           Anesthesia Physical Anesthesia Plan  ASA: III  Anesthesia Plan: General   Post-op Pain Management:    Induction: Intravenous, Rapid sequence and Cricoid pressure planned  Airway Management Planned: Oral ETT  Additional Equipment:   Intra-op Plan:   Post-operative Plan: Extubation in OR  Informed Consent: I have reviewed the patients History and Physical, chart, labs and discussed the procedure including the risks, benefits and alternatives for the proposed anesthesia with the patient or authorized representative who has indicated his/her understanding and acceptance.     Plan Discussed with:   Anesthesia Plan Comments:         Anesthesia Quick Evaluation

## 2015-04-30 NOTE — Op Note (Signed)
Patient:  Gerald AllegraRickie J Nurse  DOB:  [birth year]  MRN:  [id number]   Preop Diagnosis:  Cholecystitis, cholelithiasis  Postop Diagnosis:  Same, gangrene gallbladder, hydrops  Procedure:  Laparoscopic cholecystectomy  Surgeon:  Franky MachoMark Lavone Barrientes, M.D.  Anes:  Gen. endotracheal  Indications:  Patient is a 44 year old white male with a history of hepatitis C and polysubstance abuse who presented to the emergency room with worsening right upper quadrant abdominal pain, nausea, vomiting. He was found to have acute cholecystitis with cholelithiasis. He also had a slightly elevated total bilirubin, though this has significantly improved since admission. The patient now comes to the operating room for laparoscopic cholecystectomy with possible cholangiograms. The risks and benefits of the procedure including bleeding, infection, hepatobiliary injury, and the possibility of an open procedure were fully explained to the patient, who gave informed consent.  Procedure note:  The patient was placed in the supine position. After induction of general endotracheal anesthesia, the abdomen was prepped and draped using the usual sterile technique with DuraPrep.  A supraumbilical incision was made down to the fascia. A Veress needle was introduced into the abdominal cavity and confirmation of placement was done using the saline drop test. The abdomen was then insufflated to 16 mmHg pressure. 11 mm trocar was introduced into the abdominal cavity under direct visualization without difficulty. The patient was placed in reverse Trendelenburg position an additional 11 mm trocar was placed the epigastric region and 5 mm trochars were placed the right upper quadrant and right flank regions. The liver was inspected and noted to be somewhat generous in nature, though there were no nodules present. Multiple adhesions were noted to the underside of the abdominal wall and these were sharply divided without difficulty. The gallbladder was  faintly tenths with a very thickened gallbladder wall. The gallbladder was decompressed at the fundus and hydrops of the gallbladder was found. 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 The gallbladder was retracted in a dynamic fashion in dorsal provide a critical view of the triangle of Calot. The cystic duct was first identified. Its juncture to the abdomen was fully identified. A vascular Endo GIA was placed across the cystic duct and fired. This included the cystic artery. The gallbladder was then freed away from the gallbladder fossa using Bovie electrocautery. The gallbladder was delivered through the epigastric trocar site through an extended incision. The liver bed was inspected and a bleeding was controlled using Bovie electrocautery. The cystic duct stump was without leak. Surgicel was placed in the gallbladder fossa. All fluid and air were then evacuated from the abdominal cavity prior to the removal of the trochars.  All wounds were irrigated with normal saline. All wounds were injected with 0.5% Sensorcaine. The supraumbilical fascia as well as epigastric fascia were reapproximated using 0 Vicryl interrupted sutures. All skin incisions were closed using staples. Betadine ointment and dressed with dressings were applied.  All tape and needle counts were correct at the end of the procedure. Patient was extubated in the operating room and transferred to PACU in stable condition.  Complications:  None  EBL:  50 mL  Specimen:  Gallbladder

## 2015-04-30 NOTE — Anesthesia Procedure Notes (Signed)
Procedure Name: Intubation Date/Time: 04/30/2015 8:54 AM Performed by: Glynn OctaveANIEL, [place] Troia E Pre-anesthesia Checklist: Patient identified, Patient being monitored, Timeout performed, Emergency Drugs available and Suction available Patient Re-evaluated:Patient Re-evaluated prior to inductionOxygen Delivery Method: Circle System Utilized Preoxygenation: Pre-oxygenation with 100% oxygen Intubation Type: IV induction, Rapid sequence and Cricoid Pressure applied Ventilation: Mask ventilation without difficulty Laryngoscope Size: Mac and 3 Grade View: Grade I Tube type: Oral Tube size: 7.0 mm Number of attempts: 1 Airway Equipment and Method: Stylet Placement Confirmation: ETT inserted through vocal cords under direct vision,  positive ETCO2 and breath sounds checked- equal and bilateral Secured at: 21 cm Tube secured with: Tape Dental Injury: Teeth and Oropharynx as per pre-operative assessment

## 2015-04-30 NOTE — Transfer of Care (Signed)
Immediate Anesthesia Transfer of Care Note  Patient: Gerald Moore  Procedure(s) Performed: Procedure(s): LAPAROSCOPIC CHOLECYSTECTOMY (N/A)  Patient Location: PACU  Anesthesia Type:General  Level of Consciousness: awake, alert  and oriented  Airway & Oxygen Therapy: Patient Spontanous Breathing and Patient connected to face mask oxygen  Post-op Assessment: Report given to RN  Post vital signs: Reviewed and stable  Last Vitals:  Filed Vitals:   04/30/15 0835  BP: 154/99  Pulse:   Temp:   Resp: 13    Complications: No apparent anesthesia complications

## 2015-04-30 NOTE — Anesthesia Postprocedure Evaluation (Signed)
  Anesthesia Post-op Note  Patient: Gerald Moore  Procedure(s) Performed: Procedure(s): LAPAROSCOPIC CHOLECYSTECTOMY (N/A)  Patient Location: PACU  Anesthesia Type:General  Level of Consciousness: awake, alert  and oriented  Airway and Oxygen Therapy: Patient Spontanous Breathing  Post-op Pain: mild  Post-op Assessment: Post-op Vital signs reviewed, Patient's Cardiovascular Status Stable, Respiratory Function Stable, Patent Airway and No signs of Nausea or vomiting              Post-op Vital Signs: Reviewed and stable  Last Vitals:  Filed Vitals:   04/30/15 1025  BP:   Pulse:   Temp: 36.9 C  Resp:     Complications: No apparent anesthesia complications

## 2015-05-01 LAB — HEPATIC FUNCTION PANEL
ALT: 30 U/L (ref 17–63)
AST: 42 U/L — ABNORMAL HIGH (ref 15–41)
Albumin: 3.1 g/dL — ABNORMAL LOW (ref 3.5–5.0)
Alkaline Phosphatase: 66 U/L (ref 38–126)
BILIRUBIN DIRECT: 0.2 mg/dL (ref 0.1–0.5)
BILIRUBIN INDIRECT: 0.6 mg/dL (ref 0.3–0.9)
Total Bilirubin: 0.8 mg/dL (ref 0.3–1.2)
Total Protein: 6.5 g/dL (ref 6.5–8.1)

## 2015-05-01 LAB — BASIC METABOLIC PANEL
Anion gap: 7 (ref 5–15)
BUN: 10 mg/dL (ref 6–20)
CHLORIDE: 102 mmol/L (ref 101–111)
CO2: 25 mmol/L (ref 22–32)
CREATININE: 0.79 mg/dL (ref 0.61–1.24)
Calcium: 8.7 mg/dL — ABNORMAL LOW (ref 8.9–10.3)
GFR calc Af Amer: >60 mL/min (ref >60)
GFR calc non Af Amer: >60 mL/min (ref >60)
Glucose, Bld: 143 mg/dL — ABNORMAL HIGH (ref 65–99)
Potassium: 4 mmol/L (ref 3.5–5.1)
SODIUM: 134 mmol/L — AB (ref 135–145)

## 2015-05-01 LAB — CBC
HCT: 40.4 % (ref 39.0–52.0)
Hemoglobin: 13.2 g/dL (ref 13.0–17.0)
MCH: 31.4 pg (ref 26.0–34.0)
MCHC: 32.7 g/dL (ref 30.0–36.0)
MCV: 96 fL (ref 78.0–100.0)
PLATELETS: 253 10*3/uL (ref 150–400)
RBC: 4.21 MIL/uL — ABNORMAL LOW (ref 4.22–5.81)
RDW: 13 % (ref 11.5–15.5)
WBC: 11.9 10*3/uL — ABNORMAL HIGH (ref 4.0–10.5)

## 2015-05-01 MED ORDER — CIPROFLOXACIN HCL 500 MG PO TABS: 500.0000 mg | ORAL_TABLET | Freq: Two times a day (BID) | ORAL | Status: DC

## 2015-05-01 MED ORDER — OXYCODONE-ACETAMINOPHEN 7.5-325 MG PO TABS: 1.0000 | ORAL_TABLET | ORAL | Status: DC | PRN

## 2015-05-01 NOTE — Progress Notes (Signed)
Explained to patient that the IV medication was for break through pain and that he should try to stick with pain pills due to the fact that the doctor probably send him home on that PO pain medication.  Patient stated that he was going to get the IV medication while he was here. I told the patient that I was just concerned because a lot of patients have issues weaning off the IV medication.  Patient still calling every 2 hours for pain medication.  Will continue to monitor.

## 2015-05-01 NOTE — Discharge Summary (Signed)
Physician Discharge Summary  Patient ID: Gerald Moore MRN: [id number] DOB/AGE: 1970/11/16 44 y.o.  Admit date: 04/28/2015 Discharge date: 05/01/2015  Admission Diagnoses: Cholecystitis, cholelithiasis, hepatitis C, polysubstance abuse  Discharge Diagnoses: Same Principal Problem:   Acute on chronic cholecystitis Active Problems:   Leukocytosis   Tobacco abuse   Acute cholecystitis   Discharged Condition: good  Hospital Course: Patient is a 44 year old white male who presented emergency room with worsening right upper quadrant abdominal pain. He was found on workup to have cholecystitis secondary to cholelithiasis. He was admitted to the hospital for IV antibiotics and IV hydration. He stopped clearly underwent laparoscopic cholecystectomy on 04/30/2015. He tolerated the procedure well. Hydrops and gangrene of the gallbladder were found. His postoperative course has been unremarkable. His diet was advanced without difficulty. The patient is being discharged home on 05/01/2015 in good and improving condition.  Treatments: surgery: Laparoscopic cholecystectomy on 04/30/2015  Discharge Exam: Blood pressure 167/96, pulse 82, temperature 98.6 F (37 C), temperature source Oral, resp. rate 20, height 5\' 6"  (1.676 m), weight 73.483 kg (162 lb), SpO2 95 %. General appearance: alert, cooperative and no distress Resp: clear to auscultation bilaterally Cardio: regular rate and rhythm, S1, S2 normal, no murmur, click, rub or gallop GI: Soft, dressings dry and intact.  Disposition: 01-Home or Self Care     Medication List    STOP taking these medications        predniSONE 10 MG tablet  Commonly known as:  DELTASONE      TAKE these medications        acetaminophen 500 MG tablet  Commonly known as:  TYLENOL  Take 1,000 mg by mouth every 6 (six) hours as needed for mild pain or moderate pain.     cyclobenzaprine 5 MG tablet  Commonly known as:  FLEXERIL  Take 1 tablet (5 mg  total) by mouth 3 (three) times daily.     oxyCODONE-acetaminophen 7.5-325 MG tablet  Commonly known as:  PERCOCET  Take 1-2 tablets by mouth every 4 (four) hours as needed.           Follow-up Information    [facility]   Specialty:  Occupational Therapy   Why:  @1pm  - bring last years taxes or any proof of household income   Contact information:   [address] [place] [address] [phone]       Follow up with Dalia HeadingJENKINS,Camyra Vaeth A, MD. Schedule an appointment as soon as possible for a visit on 05/11/2015.   Specialty:  General Surgery   Contact information:   [address] [place] [address] [phone]       Signed: Franky MachoJENKINS,Rakiyah Esch A 05/01/2015, 9:47 AM

## 2015-05-01 NOTE — Discharge Instructions (Signed)
[facility]  [address] [place], [place], [id number] M-Th 8am-12pm & 1pm-4pm [phone]  Call and dial ext [phone] for eligibility requirements Laparoscopic Cholecystectomy, Care After Refer to this sheet in the next few weeks. These instructions provide you with information about caring for yourself after your procedure. Your health care provider may also give you more specific instructions. Your treatment has been planned according to current medical practices, but problems sometimes occur. Call your health care provider if you have any problems or questions after your procedure. WHAT TO EXPECT AFTER THE PROCEDURE After your procedure, it is common to have:  Pain at your incision sites. You will be given pain medicines to control your pain.  Mild nausea or vomiting. This should improve after the first 24 hours.  Bloating and possible shoulder pain from the gas that was used during the procedure. This will improve after the first 24 hours. HOME CARE INSTRUCTIONS Incision Care  Follow instructions from your health care provider about how to take care of your incisions. Make sure you:  Wash your hands with soap and water before you change your bandage (dressing). If soap and water are not available, use hand sanitizer.  Change your dressing as told by your health care provider.  Leave stitches (sutures), skin glue, or adhesive strips in place. These skin closures may need to be in place for 2 weeks or longer. If adhesive strip edges start to loosen and curl up, you may trim the loose edges. Do not remove adhesive strips completely unless your health care provider tells you to do that.  Do not take baths, swim, or use a hot tub until your health care provider approves. Ask your health care provider if you can take showers. You may only be allowed to take sponge baths for bathing. General Instructions  Take over-the-counter and prescription medicines only as told by your  health care provider.  Do not drive or operate heavy machinery while taking prescription pain medicine.  Return to your normal diet as told by your health care provider.  Do not lift anything that is heavier than 10 lb (4.5 kg).  Do not play contact sports for one week or until your health care provider approves. SEEK MEDICAL CARE IF:   You have redness, swelling, or pain at the site of your incision.  You have fluid, blood, or pus coming from your incision.  You notice a bad smell coming from your incision area.  Your surgical incisions break open.  You have a fever. SEEK IMMEDIATE MEDICAL CARE IF:  You develop a rash.  You have difficulty breathing.  You have chest pain.  You have increasing pain in your shoulders (shoulder strap areas).  You faint or have dizzy episodes while you are standing.  You have severe pain in your abdomen.  You have nausea or vomiting that lasts for more than one day.   This information is not intended to replace advice given to you by your health care provider. Make sure you discuss any questions you have with your health care provider.   Document Released: 06/12/2005 Document Revised: 03/03/2015 Document Reviewed: 01/22/2013 Elsevier Interactive Patient Education [employer]2016 Elsevier Inc.

## 2015-05-01 NOTE — Progress Notes (Signed)
Patient discharged home.  IV removed - WNL.  Reviewed medications, emphasized importance of completing dose of abx.  Instructed on follow up appointments and smoking cessations.  Educated on incisional care and s/s of infection and when to report to MD.  Trenton GammonVerbalizes understanding of DC instructions.. No questions at this time.  Stable to DC home, assisted off unit via WC by NT

## 2015-05-01 NOTE — Progress Notes (Signed)
ANTIBIOTIC CONSULT NOTE-  Pharmacy Consult for Zosyn Indication: intra-abdominal infection  No Known Allergies  Patient Measurements: Height: 5\' 6"  (167.6 cm) Weight: 162 lb (73.483 kg) IBW/kg (Calculated) : 63.8  Vital Signs: Temp: 98.6 F (37 C) (11/05 0517) Temp Source: Oral (11/05 0517) BP: 167/96 mmHg (11/05 0517) Pulse Rate: 82 (11/05 0517)  Labs:  Recent Labs  04/28/15 1110 04/29/15 0601 04/30/15 0618 05/01/15 0605  WBC 21.8* 14.1* 9.7 11.9*  HGB 15.5 14.9 14.1 13.2  PLT 223 211 254 253  CREATININE 1.09  --  0.85 0.79    Estimated Creatinine Clearance: 106.3 mL/min (by C-G formula based on Cr of 0.79).  No results for input(s): VANCOTROUGH, VANCOPEAK, VANCORANDOM, GENTTROUGH, GENTPEAK, GENTRANDOM, TOBRATROUGH, TOBRAPEAK, TOBRARND, AMIKACINPEAK, AMIKACINTROU, AMIKACIN in the last 72 hours.   Microbiology: Recent Results (from the past 720 hour(s))  Surgical pcr screen     Status: None   Collection Time: 04/30/15  2:25 AM  Result Value Ref Range Status   MRSA, PCR NEGATIVE NEGATIVE Final   Staphylococcus aureus NEGATIVE NEGATIVE Final    Comment:        The Xpert SA Assay (FDA approved for NASAL specimens in patients over 721 years of age), is one component of a comprehensive surveillance program.  Test performance has been validated by [facility] for patients greater than or equal to 44 year old. It is not intended to diagnose infection nor to guide or monitor treatment.     Medical History: Past Medical History  Diagnosis Date  . Hypertension   . Hepatitis C   . Chronic back pain   . Polysubstance abuse     benzos, opiates, cocaine, mariuana   Assessment: Pt c/o RLQ pain with vomiting since Saturday.  Estimated Creatinine Clearance: 106.3 mL/min (by C-G formula based on Cr of 0.79).  Laparoscopic cholecystectomy on 04/30/15  Goal of Therapy:  Eradicate infection.  Plan:  Continue Zosyn 3.375gm IV q8h, each dose over 4 hours Monitor  renal fxn, progress and cultures LOT per MD  Raquel JamesPittman, Shaletha Humble Bennett, RPH 05/01/2015,8:39 AM

## 2015-05-03 ENCOUNTER — Encounter (HOSPITAL_COMMUNITY): Payer: Self-pay | Admitting: General Surgery

## 2015-07-06 ENCOUNTER — Encounter (HOSPITAL_COMMUNITY): Payer: Self-pay | Admitting: Emergency Medicine

## 2015-07-06 ENCOUNTER — Emergency Department (HOSPITAL_COMMUNITY)
Admission: EM | Admit: 2015-07-06 | Discharge: 2015-07-06 | Disposition: A | Discharge status: Home / Self Care | Payer: Self-pay | Attending: Emergency Medicine | Admitting: Emergency Medicine

## 2015-07-06 ENCOUNTER — Emergency Department (HOSPITAL_COMMUNITY): Payer: Self-pay

## 2015-07-06 DIAGNOSIS — W19XXXA Unspecified fall, initial encounter: Secondary | ICD-10-CM

## 2015-07-06 DIAGNOSIS — Z8619 Personal history of other infectious and parasitic diseases: Secondary | ICD-10-CM | POA: Insufficient documentation

## 2015-07-06 DIAGNOSIS — M5431 Sciatica, right side: Secondary | ICD-10-CM | POA: Insufficient documentation

## 2015-07-06 DIAGNOSIS — Y998 Other external cause status: Secondary | ICD-10-CM | POA: Insufficient documentation

## 2015-07-06 DIAGNOSIS — G8929 Other chronic pain: Secondary | ICD-10-CM | POA: Insufficient documentation

## 2015-07-06 DIAGNOSIS — Z792 Long term (current) use of antibiotics: Secondary | ICD-10-CM | POA: Insufficient documentation

## 2015-07-06 DIAGNOSIS — W000XXA Fall on same level due to ice and snow, initial encounter: Secondary | ICD-10-CM | POA: Insufficient documentation

## 2015-07-06 DIAGNOSIS — Y9289 Other specified places as the place of occurrence of the external cause: Secondary | ICD-10-CM | POA: Insufficient documentation

## 2015-07-06 DIAGNOSIS — F17210 Nicotine dependence, cigarettes, uncomplicated: Secondary | ICD-10-CM | POA: Insufficient documentation

## 2015-07-06 DIAGNOSIS — Y9389 Activity, other specified: Secondary | ICD-10-CM | POA: Insufficient documentation

## 2015-07-06 DIAGNOSIS — I10 Essential (primary) hypertension: Secondary | ICD-10-CM | POA: Insufficient documentation

## 2015-07-06 MED ORDER — CYCLOBENZAPRINE HCL 10 MG PO TABS: 10.0000 mg | ORAL_TABLET | Freq: Three times a day (TID) | ORAL | Status: DC | PRN

## 2015-07-06 MED ORDER — OXYCODONE-ACETAMINOPHEN 5-325 MG PO TABS: 1.0000 | ORAL_TABLET | ORAL | Status: DC | PRN

## 2015-07-06 MED ORDER — OXYCODONE-ACETAMINOPHEN 5-325 MG PO TABS
1.0000 | ORAL_TABLET | Freq: Once | ORAL | Status: AC
  Administered 2015-07-06: 1 via ORAL
  Filled 2015-07-06: qty 1

## 2015-07-06 MED ORDER — IBUPROFEN 800 MG PO TABS
800.0000 mg | ORAL_TABLET | Freq: Three times a day (TID) | ORAL | Status: DC
Start: 2015-07-06 — End: 2015-12-29

## 2015-07-06 MED ORDER — IBUPROFEN 800 MG PO TABS
800.0000 mg | ORAL_TABLET | Freq: Once | ORAL | Status: AC
  Administered 2015-07-06: 800 mg via ORAL
  Filled 2015-07-06: qty 1

## 2015-07-06 NOTE — ED Notes (Signed)
PT to address HTN with primary MD this week in office.

## 2015-07-06 NOTE — ED Provider Notes (Signed)
CSN: [id number]     Arrival date & time 07/06/15  1015 History   First MD Initiated Contact with Patient 07/06/15 1016     Chief Complaint  Patient presents with  . Fall     (Consider location/radiation/quality/duration/timing/severity/associated sxs/prior Treatment) HPI   Gerald Moore is a 45 y.o. male who presents to the Emergency Department complaining of right sided low back pain for one day.  He states that he slipped on some ice while taking out the trash yesterday, landing on his right side and buttock.  He describes a sharp, stabbing pain to his right lower back that radiates into his buttock and down the back side of right thigh.  Pain improves with certain positions and worsens with bending and standing.  He has been taking tylenol without relief.  He reports chronic back pain, but pain has increased since the fall.  He denies fever, abdominal pain, urine or bowel changes, numbness or weakness of the LE's.  Also denies neck pain, head injury or LOC   Past Medical History  Diagnosis Date  . Hypertension   . Hepatitis C   . Chronic back pain   . Polysubstance abuse     benzos, opiates, cocaine, mariuana   Past Surgical History  Procedure Laterality Date  . Hernia repair    . Cholecystectomy N/A 04/30/2015    Procedure: LAPAROSCOPIC CHOLECYSTECTOMY;  Surgeon: Franky Macho, MD;  Location: [facility];  Service: General;  Laterality: N/A;   No family history on file. Social History  Substance Use Topics  . Smoking status: Current Every Day Smoker -- 1.50 packs/day    Types: Cigarettes  . Smokeless tobacco: Not on file  . Alcohol Use: No    Review of Systems  Constitutional: Negative for fever.  Respiratory: Negative for shortness of breath.   Gastrointestinal: Negative for vomiting, abdominal pain and constipation.  Genitourinary: Negative for dysuria, hematuria, flank pain, decreased urine volume and difficulty urinating.  Musculoskeletal: Positive for back pain.  Negative for joint swelling.  Skin: Negative for rash.  Neurological: Negative for weakness and numbness.  All other systems reviewed and are negative.     Allergies  Review of patient's allergies indicates no known allergies.  Home Medications   Prior to Admission medications   Medication Sig Start Date End Date Taking? Authorizing Provider  acetaminophen (TYLENOL) 500 MG tablet Take 1,000 mg by mouth every 6 (six) hours as needed for mild pain or moderate pain.    Historical Provider, MD  ciprofloxacin (CIPRO) 500 MG tablet Take 1 tablet (500 mg total) by mouth 2 (two) times daily. 05/01/15   Franky Macho, MD  cyclobenzaprine (FLEXERIL) 5 MG tablet Take 1 tablet (5 mg total) by mouth 3 (three) times daily. Patient not taking: Reported on 04/28/2015 08/30/14   Burgess Amor, PA-C  oxyCODONE-acetaminophen (PERCOCET) 7.5-325 MG tablet Take 1-2 tablets by mouth every 4 (four) hours as needed. 05/01/15   Franky Macho, MD   BP 194/115 mmHg  Pulse 68  Temp(Src) 97.9 F (36.6 C) (Oral)  Resp 16  Ht 5\' 5"  (1.651 m)  Wt 77.111 kg  BMI 28.29 kg/m2  SpO2 100% Physical Exam  Constitutional: He is oriented to person, place, and time. He appears well-developed and well-nourished. No distress.  HENT:  Head: Normocephalic and atraumatic.  Neck: Normal range of motion. Neck supple.  Cardiovascular: Normal rate, regular rhythm, normal heart sounds and intact distal pulses.   No murmur heard. Pulmonary/Chest: Effort normal and breath sounds  normal. No respiratory distress.  Musculoskeletal: He exhibits tenderness. He exhibits no edema.       Lumbar back: He exhibits tenderness and pain. He exhibits normal range of motion, no swelling, no deformity, no laceration and normal pulse.  ttp of the lower lumbar spine and right paraspinal muscles.  DP pulses are brisk and symmetrical.  Distal sensation intact.  Pt has 5/5 strength against resistance of bilateral lower extremities.     Neurological: He is  alert and oriented to person, place, and time. He has normal strength. No sensory deficit. He exhibits normal muscle tone. Coordination and gait normal.  Reflex Scores:      Patellar reflexes are 2+ on the right side and 2+ on the left side.      Achilles reflexes are 2+ on the right side and 2+ on the left side. Skin: Skin is warm and dry. No rash noted.  Nursing note and vitals reviewed.   ED Course  Procedures (including critical care time) Labs Review Labs Reviewed - No data to display  Imaging Review Dg Lumbar Spine Complete  07/06/2015  CLINICAL DATA:  Slipped and fell on ice yesterday. Low back pain radiating down right leg EXAM: LUMBAR SPINE - COMPLETE 4+ VIEW COMPARISON:  05/12/2013 FINDINGS: Degenerative spurring throughout the lumbar spine. Disc spaces are maintained. Normal alignment. No fracture. SI joints are symmetric and unremarkable. IMPRESSION: No acute bony abnormality. Electronically Signed   By: Charlett NoseKevin  Dover M.D.   On: 07/06/2015 11:12   I have personally reviewed and evaluated these images and lab results as part of my medical decision-making.   EKG Interpretation None      MDM   Final diagnoses:  Sciatica of right side  Fall, initial encounter    Pt reviewed on the [place] narcotic database.  Received #60 percocet 7.5/325 mg tabs on 05/11/15  Pt ambulates with a slow but steady gait.  No focal neuro deficits on exam.  XR neg for acute findings.  Likely lumbar strain, but also discussed possible HNP.  Pt appears stable for symptomatic tx no concerning sx's for emergent neurological or infectious process.    Pt remains hypertensive on recheck, admits to hx of HTN, but doesn't take medication due to "makes me feel dizzy" I have counseled him of risks of uncontrolled HTN and he agrees to arrange close f/u with [facility] or with Dr. Robynn PaneHasani in [place].  I feel that he is stable for d/c, and he agrees to return here for any worsening symtpoms  Pauline Ausammy Ingeborg Fite,  PA-C 07/06/15 1252  Blane OharaJoshua Zavitz, MD 07/10/15 1200

## 2015-07-06 NOTE — ED Notes (Signed)
Pt states that he slipped and fell on ice yesterday and is c/o right sided lower back pain.

## 2015-07-06 NOTE — Discharge Instructions (Signed)
Sciatica °Sciatica is pain, weakness, numbness, or tingling along your sciatic nerve. The nerve starts in the lower back and runs down the back of each leg. Nerve damage or certain conditions pinch or put pressure on the sciatic nerve. This causes the pain, weakness, and other discomforts of sciatica. °HOME CARE  °· Only take medicine as told by your doctor. °· Apply ice to the affected area for 20 minutes. Do this 3-4 times a day for the first 48-72 hours. Then try heat in the same way. °· Exercise, stretch, or do your usual activities if these do not make your pain worse. °· Go to physical therapy as told by your doctor. °· Keep all doctor visits as told. °· Do not wear high heels or shoes that are not supportive. °· Get a firm mattress if your mattress is too soft to lessen pain and discomfort. °GET HELP RIGHT AWAY IF:  °· You cannot control when you poop (bowel movement) or pee (urinate). °· You have more weakness in your lower back, lower belly (pelvis), butt (buttocks), or legs. °· You have redness or puffiness (swelling) of your back. °· You have a burning feeling when you pee. °· You have pain that gets worse when you lie down. °· You have pain that wakes you from your sleep. °· Your pain is worse than past pain. °· Your pain lasts longer than 4 weeks. °· You are suddenly losing weight without reason. °MAKE SURE YOU:  °· Understand these instructions. °· Will watch this condition. °· Will get help right away if you are not doing well or get worse. °  °This information is not intended to replace advice given to you by your health care provider. Make sure you discuss any questions you have with your health care provider. °  °Document Released: 03/21/2008 Document Revised: 03/03/2015 Document Reviewed: 10/22/2011 °Elsevier Interactive Patient Education ©2016 Elsevier Inc. ° ° °[facility] Doctor List ° ° ° °Edward Hawkins MD. Specialty: Pulmonary Disease Contact information: [address]  °[address]  °[place] [address]  °[phone]  ° °Margaret Simpson, MD. Specialty: Family Medicine Contact information: [address]  °[place] [address]  °[phone]  ° °Scott Luking, MD. Specialty: Family Medicine Contact information: [address]  °[address]  °[place] [address]  °[phone]  ° °Tesfaye Fanta, MD Specialty: Internal Medicine Contact information: [address]  °[place] [address]  °[phone]  ° °Zach Hall, MD. Specialty: Internal Medicine Contact information: [address]  °[place] [address]  °[phone]  ° °Angus Mcinnis, MD. Specialty: Family Medicine Contact information: [address]  °[place] [address]  °[phone]  ° °Stephen Knowlton, MD. Specialty: Family Medicine Contact information: [address]  °[address]  °[place] [address]  °[phone]  ° °Roy Fagan, MD. Specialty: Internal Medicine Contact information: [address]  °[address]  °  [address]  °[phone]  ° ° °

## 2015-12-22 ENCOUNTER — Encounter (HOSPITAL_COMMUNITY): Payer: Self-pay | Admitting: Emergency Medicine

## 2015-12-22 ENCOUNTER — Emergency Department (HOSPITAL_COMMUNITY)
Admission: EM | Admit: 2015-12-22 | Discharge: 2015-12-22 | Disposition: A | Discharge status: Home / Self Care | Attending: Emergency Medicine | Admitting: Emergency Medicine

## 2015-12-22 DIAGNOSIS — F17210 Nicotine dependence, cigarettes, uncomplicated: Secondary | ICD-10-CM | POA: Diagnosis not present

## 2015-12-22 DIAGNOSIS — S62619A Displaced fracture of proximal phalanx of unspecified finger, initial encounter for closed fracture: Secondary | ICD-10-CM

## 2015-12-22 DIAGNOSIS — X58XXXA Exposure to other specified factors, initial encounter: Secondary | ICD-10-CM | POA: Insufficient documentation

## 2015-12-22 DIAGNOSIS — Y9339 Activity, other involving climbing, rappelling and jumping off: Secondary | ICD-10-CM | POA: Diagnosis not present

## 2015-12-22 DIAGNOSIS — Y999 Unspecified external cause status: Secondary | ICD-10-CM | POA: Insufficient documentation

## 2015-12-22 DIAGNOSIS — I10 Essential (primary) hypertension: Secondary | ICD-10-CM | POA: Insufficient documentation

## 2015-12-22 DIAGNOSIS — S6992XA Unspecified injury of left wrist, hand and finger(s), initial encounter: Secondary | ICD-10-CM | POA: Diagnosis present

## 2015-12-22 DIAGNOSIS — Y929 Unspecified place or not applicable: Secondary | ICD-10-CM | POA: Insufficient documentation

## 2015-12-22 DIAGNOSIS — S62611A Displaced fracture of proximal phalanx of left index finger, initial encounter for closed fracture: Secondary | ICD-10-CM | POA: Diagnosis not present

## 2015-12-22 DIAGNOSIS — Z79899 Other long term (current) drug therapy: Secondary | ICD-10-CM | POA: Insufficient documentation

## 2015-12-22 MED ORDER — HYDROCODONE-ACETAMINOPHEN 5-325 MG PO TABS
1.0000 | ORAL_TABLET | Freq: Once | ORAL | Status: AC
  Administered 2015-12-22: 1 via ORAL
  Filled 2015-12-22: qty 1

## 2015-12-22 MED ORDER — NAPROXEN 375 MG PO TABS: 375.0000 mg | ORAL_TABLET | Freq: Two times a day (BID) | ORAL | Status: DC
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
## 2015-12-22 NOTE — Discharge Instructions (Signed)
Take your naproxen twice a day with food.  apply ice to the hand every 4 hours during the daytime for the first 6 days. The hand should be elevated above the heart during this time. Follow up in 1 week with Dr. Melvyn Novasrtmann.   Finger Fracture Fractures of fingers are breaks in the bones of the fingers. There are many types of fractures. There are different ways of treating these fractures. Your health care provider will discuss the best way to treat your fracture. CAUSES Traumatic injury is the main cause of broken fingers. These include:  Injuries while playing sports.  Workplace injuries.  Falls. RISK FACTORS Activities that can increase your risk of finger fractures include:  Sports.  Workplace activities that involve machinery.  A condition called osteoporosis, which can make your bones less dense and cause them to fracture more easily. SIGNS AND SYMPTOMS The main symptoms of a broken finger are pain and swelling within 15 minutes after the injury. Other symptoms include:  Bruising of your finger.  Stiffness of your finger.  Numbness of your finger.  Exposed bones (compound fracture) if the fracture is severe. DIAGNOSIS  The best way to diagnose a broken bone is with X-ray imaging. Additionally, your health care provider will use this X-ray image to evaluate the position of the broken finger bones.  TREATMENT  Finger fractures can be treated with:   Nonreduction--This means the bones are in place. The finger is splinted without changing the positions of the bone pieces. The splint is usually left on for about a week to 10 days. This will depend on your fracture and what your health care provider thinks.  Closed reduction--The bones are put back into position without using surgery. The finger is then splinted.  Open reduction and internal fixation--The fracture site is opened. Then the bone pieces are fixed into place with pins or some type of hardware. This is seldom  required. It depends on the severity of the fracture. HOME CARE INSTRUCTIONS   Follow your health care provider's instructions regarding activities, exercises, and physical therapy.  Only take over-the-counter or prescription medicines for pain, discomfort, or fever as directed by your health care provider. SEEK MEDICAL CARE IF: You have pain or swelling that limits the motion or use of your fingers. SEEK IMMEDIATE MEDICAL CARE IF:  Your finger becomes numb. MAKE SURE YOU:   Understand these instructions.  Will watch your condition.  Will get help right away if you are not doing well or get worse.   This information is not intended to replace advice given to you by your health care provider. Make sure you discuss any questions you have with your health care provider.   Document Released: 09/24/2000 Document Revised: 04/02/2013 Document Reviewed: 01/22/2013 Elsevier Interactive Patient Education [employer]2016 [employer] Inc[employer]

## 2015-12-29 ENCOUNTER — Encounter (HOSPITAL_COMMUNITY): Payer: Self-pay | Admitting: Emergency Medicine

## 2015-12-29 ENCOUNTER — Emergency Department (HOSPITAL_COMMUNITY): Payer: Self-pay

## 2015-12-29 ENCOUNTER — Emergency Department (HOSPITAL_COMMUNITY)
Admission: EM | Admit: 2015-12-29 | Discharge: 2015-12-29 | Disposition: A | Discharge status: Home / Self Care | Payer: Self-pay | Attending: Emergency Medicine | Admitting: Emergency Medicine

## 2015-12-29 DIAGNOSIS — Y999 Unspecified external cause status: Secondary | ICD-10-CM | POA: Insufficient documentation

## 2015-12-29 DIAGNOSIS — Y92149 Unspecified place in prison as the place of occurrence of the external cause: Secondary | ICD-10-CM | POA: Insufficient documentation

## 2015-12-29 DIAGNOSIS — F17210 Nicotine dependence, cigarettes, uncomplicated: Secondary | ICD-10-CM | POA: Insufficient documentation

## 2015-12-29 DIAGNOSIS — Z79899 Other long term (current) drug therapy: Secondary | ICD-10-CM | POA: Insufficient documentation

## 2015-12-29 DIAGNOSIS — Y939 Activity, unspecified: Secondary | ICD-10-CM | POA: Insufficient documentation

## 2015-12-29 DIAGNOSIS — S62619D Displaced fracture of proximal phalanx of unspecified finger, subsequent encounter for fracture with routine healing: Secondary | ICD-10-CM

## 2015-12-29 DIAGNOSIS — S2232XA Fracture of one rib, left side, initial encounter for closed fracture: Secondary | ICD-10-CM

## 2015-12-29 DIAGNOSIS — I10 Essential (primary) hypertension: Secondary | ICD-10-CM | POA: Insufficient documentation

## 2015-12-29 DIAGNOSIS — S62611D Displaced fracture of proximal phalanx of left index finger, subsequent encounter for fracture with routine healing: Secondary | ICD-10-CM | POA: Insufficient documentation

## 2015-12-29 DIAGNOSIS — S2242XA Multiple fractures of ribs, left side, initial encounter for closed fracture: Secondary | ICD-10-CM | POA: Insufficient documentation

## 2015-12-29 MED ORDER — HYDROCODONE-ACETAMINOPHEN 5-325 MG PO TABS
1.0000 | ORAL_TABLET | Freq: Once | ORAL | Status: AC
  Administered 2015-12-29: 1 via ORAL
  Filled 2015-12-29: qty 1

## 2015-12-29 MED ORDER — HYDROCODONE-ACETAMINOPHEN 5-325 MG PO TABS: ORAL_TABLET | ORAL | Status: DC

## 2015-12-29 MED ORDER — IBUPROFEN 800 MG PO TABS: 800.0000 mg | ORAL_TABLET | Freq: Three times a day (TID) | ORAL | Status: DC

## 2015-12-29 MED ORDER — IBUPROFEN 800 MG PO TABS
800.0000 mg | ORAL_TABLET | Freq: Once | ORAL | Status: AC
  Administered 2015-12-29: 800 mg via ORAL
  Filled 2015-12-29: qty 1
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
## 2015-12-29 NOTE — Discharge Instructions (Signed)

## 2016-05-08 ENCOUNTER — Emergency Department (HOSPITAL_COMMUNITY): Payer: PRIVATE HEALTH INSURANCE

## 2016-05-08 ENCOUNTER — Encounter (HOSPITAL_COMMUNITY): Payer: Self-pay | Admitting: Emergency Medicine

## 2016-05-08 ENCOUNTER — Emergency Department (HOSPITAL_COMMUNITY)
Admission: EM | Admit: 2016-05-08 | Discharge: 2016-05-08 | Disposition: A | Discharge status: Home / Self Care | Payer: PRIVATE HEALTH INSURANCE | Attending: Emergency Medicine | Admitting: Emergency Medicine

## 2016-05-08 DIAGNOSIS — Y929 Unspecified place or not applicable: Secondary | ICD-10-CM | POA: Insufficient documentation

## 2016-05-08 DIAGNOSIS — S40011A Contusion of right shoulder, initial encounter: Secondary | ICD-10-CM | POA: Insufficient documentation

## 2016-05-08 DIAGNOSIS — S8012XA Contusion of left lower leg, initial encounter: Secondary | ICD-10-CM | POA: Insufficient documentation

## 2016-05-08 DIAGNOSIS — Y9389 Activity, other specified: Secondary | ICD-10-CM | POA: Insufficient documentation

## 2016-05-08 DIAGNOSIS — Y990 Civilian activity done for income or pay: Secondary | ICD-10-CM | POA: Insufficient documentation

## 2016-05-08 DIAGNOSIS — Z79899 Other long term (current) drug therapy: Secondary | ICD-10-CM | POA: Insufficient documentation

## 2016-05-08 DIAGNOSIS — W19XXXA Unspecified fall, initial encounter: Secondary | ICD-10-CM

## 2016-05-08 DIAGNOSIS — I10 Essential (primary) hypertension: Secondary | ICD-10-CM | POA: Insufficient documentation

## 2016-05-08 DIAGNOSIS — W12XXXA Fall on and from scaffolding, initial encounter: Secondary | ICD-10-CM | POA: Insufficient documentation

## 2016-05-08 DIAGNOSIS — F17210 Nicotine dependence, cigarettes, uncomplicated: Secondary | ICD-10-CM | POA: Insufficient documentation

## 2016-05-08 DIAGNOSIS — S300XXA Contusion of lower back and pelvis, initial encounter: Secondary | ICD-10-CM | POA: Insufficient documentation

## 2016-05-08 MED ORDER — HYDROCODONE-ACETAMINOPHEN 5-325 MG PO TABS: 2.0000 | ORAL_TABLET | ORAL | 0 refills | Status: DC | PRN

## 2016-05-08 MED ORDER — OXYCODONE-ACETAMINOPHEN 5-325 MG PO TABS
2.0000 | ORAL_TABLET | Freq: Once | ORAL | Status: AC
  Administered 2016-05-08: 2 via ORAL
  Filled 2016-05-08: qty 2

## 2016-05-08 MED ORDER — METHOCARBAMOL 500 MG PO TABS: 500.0000 mg | ORAL_TABLET | Freq: Two times a day (BID) | ORAL | 0 refills | Status: DC

## 2016-05-08 MED ORDER — IBUPROFEN 800 MG PO TABS: 800.0000 mg | ORAL_TABLET | Freq: Three times a day (TID) | ORAL | 0 refills | Status: DC

## 2016-05-08 NOTE — ED Provider Notes (Signed)
[facility] Provider Note   CSN: [id number] Arrival date & time: 05/08/16  1033  By signing my name below, I, Emmanuella Mensah, attest that this documentation has been prepared under the direction and in the presence of Langston MaskerKaren Joslynne Klatt, [place]PA-C. Electronically Signed: Angelene GiovanniEmmanuella Mensah, ED Scribe. 05/08/16. 11:38 AM.   History   Chief Complaint Chief Complaint  Patient presents with  . Fall   HPI Comments: Gerald Moore is a 45 y.o. male with a hx of hypertension and chronic back pain who presents to the Emergency Department complaining of gradually worsening moderate lower back pain s/p fall that occurred PTA. He reports associated right shoulder pain and bruising to his RLE. He explains that he was on a 4612ft scaffolding working when he fell onto his back and left shoulder. He adds that a board fell on his right lower leg. He denies any head injuries or LOC. Pt has been able to ambulate after the fall. No alleviating factors noted. Pt has not tried any medications PTA. He has NKDA. He denies any fever, nausea, vomiting, abdominal pain, hip pain, bowel/bladder incontinence, urinary symptoms, or any other symptoms.   The history is provided by the patient. No language interpreter was used.    Past Medical History:  Diagnosis Date  . Chronic back pain   . Hepatitis C   . Hypertension   . Polysubstance abuse    benzos, opiates, cocaine, mariuana    Patient Active Problem List   Diagnosis Date Noted  . Acute on chronic cholecystitis 04/28/2015  . Leukocytosis 04/28/2015  . Tobacco abuse 04/28/2015  . Acute cholecystitis 04/28/2015    Past Surgical History:  Procedure Laterality Date  . CHOLECYSTECTOMY N/A 04/30/2015   Procedure: LAPAROSCOPIC CHOLECYSTECTOMY;  Surgeon: Franky MachoMark Jenkins, MD;  Location: [facility];  Service: General;  Laterality: N/A;  . HERNIA REPAIR         Home Medications    Prior to Admission medications   Medication Sig Start Date End Date Taking?  Authorizing Provider  acetaminophen (TYLENOL) 500 MG tablet Take 1,000 mg by mouth every 6 (six) hours as needed for mild pain or moderate pain.    Historical Provider, MD  cyclobenzaprine (FLEXERIL) 10 MG tablet Take 1 tablet (10 mg total) by mouth 3 (three) times daily as needed. Patient not taking: Reported on 12/29/2015 07/06/15   Tammy Triplett, PA-C  HYDROcodone-acetaminophen (NORCO/VICODIN) 5-325 MG tablet Take one-two tabs po q 4-6 hrs prn pain 12/29/15   Tammy Triplett, PA-C  ibuprofen (ADVIL,MOTRIN) 800 MG tablet Take 1 tablet (800 mg total) by mouth 3 (three) times daily. 12/29/15   Tammy Triplett, PA-C  naproxen (NAPROSYN) 375 MG tablet Take 1 tablet (375 mg total) by mouth 2 (two) times daily. Patient not taking: Reported on 12/29/2015 12/22/15   Arthor CaptainAbigail Harris, PA-C  oxyCODONE-acetaminophen (PERCOCET/ROXICET) 5-325 MG tablet Take 1 tablet by mouth every 4 (four) hours as needed. Patient not taking: Reported on 12/29/2015 07/06/15   Pauline Ausammy Triplett, PA-C    Family History History reviewed. No pertinent family history.  Social History Social History  Substance Use Topics  . Smoking status: Current Every Day Smoker    Packs/day: 1.50    Types: Cigarettes  . Smokeless tobacco: Never Used  . Alcohol use No     Allergies   Patient has no known allergies.   Review of Systems Review of Systems  Constitutional: Negative for fever.  Gastrointestinal: Negative for abdominal pain, nausea and vomiting.  Genitourinary: Negative for dysuria and  hematuria.  Musculoskeletal: Positive for arthralgias and back pain.  All other systems reviewed and are negative.    Physical Exam Updated Vital Signs BP (!) 180/112 (BP Location: Left Arm)   Pulse 84   Temp 98.5 F (36.9 C) (Oral)   Resp 20   Ht 5\' 6"  (1.676 m)   Wt 175 lb (79.4 kg)   SpO2 99%   BMI 28.25 kg/m   Physical Exam  Constitutional: He is oriented to person, place, and time. He appears well-developed and well-nourished. No  distress.  HENT:  Head: Normocephalic and atraumatic.  Eyes: Conjunctivae and EOM are normal.  Neck: Neck supple. No tracheal deviation present.  Cardiovascular: Normal rate.   Pulmonary/Chest: Effort normal. No respiratory distress.  Musculoskeletal: Normal range of motion. He exhibits tenderness.  Diffusely tenderness lumbar spine Small area of bruising to RLE Tender right shoulder with full ROM  Neurological: He is alert and oriented to person, place, and time.  Skin: Skin is warm and dry.  Psychiatric: He has a normal mood and affect. His behavior is normal.  Nursing note and vitals reviewed.    ED Treatments / Results  DIAGNOSTIC STUDIES: Oxygen Saturation is 99% on RA, normal by my interpretation.    COORDINATION OF CARE: 11:28 AM- Pt advised of plan for treatment and pt agrees. Pt will receive lumbar spine and pelvis x-ray for further evaluation.   Labs (all labs ordered are listed, but only abnormal results are displayed) Labs Reviewed - No data to display  EKG  EKG Interpretation None       Radiology No results found.  Procedures Procedures (including critical care time)  Medications Ordered in ED Medications - No data to display   Initial Impression / Assessment and Plan / ED Course  Langston MaskerKaren Ronniesha Seibold, PA-C has reviewed the triage vital signs and the nursing notes.  Pertinent labs & imaging results that were available during my care of the patient were reviewed by me and considered in my medical decision making (see chart for details).  Clinical Course      Patient with back pain after fall.  No neurological deficits and normal neuro exam.  Patient can walk but states is painful.  No loss of bowel or bladder control.  No concern for cauda equina.  No fever, night sweats, weight loss, h/o cancer, IVDU.  RICE protocol and pain medicine indicated and discussed with patient.   Patient X-Ray negative for obvious fracture or dislocation. Pain managed in ED. Pt  advised to follow up with orthopedics if symptoms persist for possibility of missed fracture diagnosis. Patient given brace while in ED, conservative therapy recommended and discussed. Patient will be dc home & is agreeable with above plan.   Final Clinical Impressions(s) / ED Diagnoses   Final diagnoses:  Fall, initial encounter  Contusion of lower back, initial encounter  Contusion of right shoulder, initial encounter  Contusion of left lower extremity, initial encounter    New Prescriptions New Prescriptions   No medications on file  An After Visit Summary was printed and given to the patient. Meds ordered this encounter  Medications  . oxyCODONE-acetaminophen (PERCOCET/ROXICET) 5-325 MG per tablet 2 tablet  . methocarbamol (ROBAXIN) 500 MG tablet    Sig: Take 1 tablet (500 mg total) by mouth 2 (two) times daily.    Dispense:  20 tablet    Refill:  0    Order Specific Question:   Supervising Provider    Answer:   Eber HongMILLER, BRIAN [  [id number]]  . ibuprofen (ADVIL,MOTRIN) 800 MG tablet    Sig: Take 1 tablet (800 mg total) by mouth 3 (three) times daily.    Dispense:  21 tablet    Refill:  0    Order Specific Question:   Supervising Provider    Answer:   Eber Hong [[id number]]     Lonia Skinner [place], PA-C 05/08/16 1226    Geoffery Lyons, MD 05/08/16 1940

## 2016-05-08 NOTE — ED Triage Notes (Signed)
Pt reports scaffolding fell over, pt fell on his back. Pt ambulatory to Triage without difficulty. Pt c/o pain to his lower and mid back.

## 2016-05-08 NOTE — Discharge Instructions (Signed)
Return if any problems.  See your Physician for recheck in 1 week if pain persist °

## 2016-05-24 ENCOUNTER — Emergency Department (HOSPITAL_COMMUNITY): Payer: PRIVATE HEALTH INSURANCE

## 2016-05-24 ENCOUNTER — Emergency Department (HOSPITAL_COMMUNITY)
Admission: EM | Admit: 2016-05-24 | Discharge: 2016-05-24 | Disposition: A | Discharge status: Home / Self Care | Payer: PRIVATE HEALTH INSURANCE | Attending: Emergency Medicine | Admitting: Emergency Medicine

## 2016-05-24 ENCOUNTER — Encounter (HOSPITAL_COMMUNITY): Payer: Self-pay

## 2016-05-24 DIAGNOSIS — F17210 Nicotine dependence, cigarettes, uncomplicated: Secondary | ICD-10-CM | POA: Insufficient documentation

## 2016-05-24 DIAGNOSIS — J189 Pneumonia, unspecified organism: Secondary | ICD-10-CM

## 2016-05-24 DIAGNOSIS — I10 Essential (primary) hypertension: Secondary | ICD-10-CM | POA: Insufficient documentation

## 2016-05-24 MED ORDER — CEFTRIAXONE SODIUM 1 G IJ SOLR
1.0000 g | Freq: Once | INTRAMUSCULAR | Status: AC
  Administered 2016-05-24: 1 g via INTRAMUSCULAR
  Filled 2016-05-24: qty 10

## 2016-05-24 MED ORDER — HYDROCODONE-ACETAMINOPHEN 5-325 MG PO TABS
1.0000 | ORAL_TABLET | Freq: Once | ORAL | Status: AC
  Administered 2016-05-24: 1 via ORAL
  Filled 2016-05-24: qty 1

## 2016-05-24 MED ORDER — PREDNISONE 50 MG PO TABS
60.0000 mg | ORAL_TABLET | Freq: Once | ORAL | Status: AC
  Administered 2016-05-24: 60 mg via ORAL
  Filled 2016-05-24: qty 1

## 2016-05-24 MED ORDER — IPRATROPIUM-ALBUTEROL 0.5-2.5 (3) MG/3ML IN SOLN
3.0000 mL | Freq: Once | RESPIRATORY_TRACT | Status: AC
  Administered 2016-05-24: 3 mL via RESPIRATORY_TRACT
  Filled 2016-05-24: qty 3

## 2016-05-24 MED ORDER — AZITHROMYCIN 250 MG PO TABS: ORAL_TABLET | ORAL | 0 refills | Status: DC

## 2016-05-24 MED ORDER — HYDROCODONE-ACETAMINOPHEN 5-325 MG PO TABS: 1.0000 | ORAL_TABLET | ORAL | 0 refills | Status: DC | PRN

## 2016-05-24 MED ORDER — ALBUTEROL SULFATE (2.5 MG/3ML) 0.083% IN NEBU
2.5000 mg | INHALATION_SOLUTION | Freq: Once | RESPIRATORY_TRACT | Status: AC
  Administered 2016-05-24: 2.5 mg via RESPIRATORY_TRACT
  Filled 2016-05-24: qty 3

## 2016-05-24 MED ORDER — ALBUTEROL SULFATE HFA 108 (90 BASE) MCG/ACT IN AERS
2.0000 | INHALATION_SPRAY | Freq: Four times a day (QID) | RESPIRATORY_TRACT | Status: DC
  Administered 2016-05-24: 2 via RESPIRATORY_TRACT
  Filled 2016-05-24: qty 6.7

## 2016-05-24 MED ORDER — IBUPROFEN 800 MG PO TABS
800.0000 mg | ORAL_TABLET | Freq: Once | ORAL | Status: AC
  Administered 2016-05-24: 800 mg via ORAL
  Filled 2016-05-24: qty 1

## 2016-05-24 MED ORDER — STERILE WATER FOR INJECTION IJ SOLN
INTRAMUSCULAR | Status: AC
  Administered 2016-05-24: 2.1 mL
  Filled 2016-05-24: qty 10

## 2016-05-24 NOTE — Discharge Instructions (Signed)
2 puffs of the inhaler 4 times a day as needed. Ibuprofen 800 mg 3 times a day with food as needed for fever or body aches. Return to the ER for any worsening symptoms.

## 2016-05-24 NOTE — ED Notes (Signed)
Pt returned from xray

## 2016-05-24 NOTE — ED Triage Notes (Signed)
Complain of cough, congestion and aching all over for about two weeks,

## 2016-05-24 NOTE — ED Provider Notes (Signed)
[facility]-EMERGENCY DEPT Provider Note   CSN: [id number] Arrival date & time: 05/24/16  0841     History   Chief Complaint Chief Complaint  Patient presents with  . Cough    congestion    HPI Gerald Moore is a 45 y.o. male.  HPI   Gerald Moore is a 45 y.o. male who presents to the Emergency Department complaining of Persistent cough and nasal congestion for 2 weeks. He states the cough has been productive and has been associated with wheezing and chest tightness. He also reports intermittent sweating and chills with generalized body aches. He's been taking over-the-counter cold medications without relief. He also reports pain to his left lower back that associated with coughing. He has not checked his temperature at home. He denies known fever, abdominal pain, vomiting, dysuria or shortness of breath.   Past Medical History:  Diagnosis Date  . Chronic back pain   . Hepatitis C   . Hypertension   . Polysubstance abuse    benzos, opiates, cocaine, mariuana    Patient Active Problem List   Diagnosis Date Noted  . Acute on chronic cholecystitis 04/28/2015  . Leukocytosis 04/28/2015  . Tobacco abuse 04/28/2015  . Acute cholecystitis 04/28/2015    Past Surgical History:  Procedure Laterality Date  . CHOLECYSTECTOMY N/A 04/30/2015   Procedure: LAPAROSCOPIC CHOLECYSTECTOMY;  Surgeon: Franky MachoMark Jenkins, MD;  Location: [facility];  Service: General;  Laterality: N/A;  . HERNIA REPAIR         Home Medications    Prior to Admission medications   Medication Sig Start Date End Date Taking? Authorizing Provider  acetaminophen (TYLENOL) 500 MG tablet Take 1,000 mg by mouth every 6 (six) hours as needed for mild pain or moderate pain.    Historical Provider, MD  HYDROcodone-acetaminophen (NORCO/VICODIN) 5-325 MG tablet Take 2 tablets by mouth every 4 (four) hours as needed. 05/08/16   Elson AreasLeslie K Sofia, PA-C  ibuprofen (ADVIL,MOTRIN) 800 MG tablet Take 1 tablet (800 mg total) by mouth 3  (three) times daily. 05/08/16   Elson AreasLeslie K Sofia, PA-C  methocarbamol (ROBAXIN) 500 MG tablet Take 1 tablet (500 mg total) by mouth 2 (two) times daily. 05/08/16   Elson AreasLeslie K Sofia, PA-C    Family History No family history on file.  Social History Social History  Substance Use Topics  . Smoking status: Current Every Day Smoker    Packs/day: 1.50    Types: Cigarettes  . Smokeless tobacco: Never Used  . Alcohol use No     Allergies   Patient has no known allergies.   Review of Systems Review of Systems  Constitutional: Positive for chills. Negative for appetite change and fever.  HENT: Positive for congestion and rhinorrhea. Negative for sore throat and trouble swallowing.   Respiratory: Positive for cough, chest tightness and wheezing. Negative for shortness of breath.   Cardiovascular: Negative for chest pain.  Gastrointestinal: Negative for abdominal pain, nausea and vomiting.  Genitourinary: Negative for dysuria.  Musculoskeletal: Positive for myalgias. Negative for arthralgias.  Skin: Negative for rash.  Neurological: Negative for dizziness, weakness and numbness.  Hematological: Negative for adenopathy.  All other systems reviewed and are negative.    Physical Exam Updated Vital Signs BP 135/98 (BP Location: Left Arm)   Pulse 101   Temp 98.2 F (36.8 C) (Oral)   Resp 20   SpO2 93%   Physical Exam  Constitutional: He is oriented to person, place, and time. He appears well-developed and well-nourished. No  distress.  HENT:  Head: Normocephalic and atraumatic.  Right Ear: Tympanic membrane and ear canal normal.  Left Ear: Tympanic membrane and ear canal normal.  Nose: Mucosal edema present.  Mouth/Throat: Uvula is midline, oropharynx is clear and moist and mucous membranes are normal. No oropharyngeal exudate.  Eyes: EOM are normal. Pupils are equal, round, and reactive to light.  Neck: Normal range of motion, full passive range of motion without pain and phonation  normal. Neck supple.  Cardiovascular: Normal rate and regular rhythm.   No murmur heard. Pulmonary/Chest: Effort normal. No stridor. No respiratory distress. He has no rales. He exhibits no tenderness.  Coarse lungs sounds bilaterally.  Few scattered wheezes and rales at the left base.  Abdominal: Soft. He exhibits no distension. There is no tenderness.  Musculoskeletal: He exhibits no edema.  Focal tenderness of the left lower lumbar paraspinal muscles. No spinal tenderness or bony step-offs on exam.  Lymphadenopathy:    He has no cervical adenopathy.  Neurological: He is alert and oriented to person, place, and time. He exhibits normal muscle tone. Coordination normal.  Skin: Skin is warm and dry.  Nursing note and vitals reviewed.    ED Treatments / Results  Labs (all labs ordered are listed, but only abnormal results are displayed) Labs Reviewed - No data to display  EKG  EKG Interpretation None       Radiology Dg Chest 2 View  Result Date: 05/24/2016 CLINICAL DATA:  Cough and chest congestion. Smoking history. Fever. EXAM: CHEST  2 VIEW COMPARISON:  12/29/2015.  09/12/2015. FINDINGS: There are patchy infiltrates in the right middle lobe and lingula consistent with bronchopneumonia. There is bronchial thickening. Chronic scarring seen at the right base as previously. No effusions. No acute bone finding. IMPRESSION: Patchy bronchopneumonia in the right middle lobe and lingula. Electronically Signed   By: Paulina FusiMark  Shogry M.D.   On: 05/24/2016 09:49    Procedures Procedures (including critical care time)  Medications Ordered in ED Medications  ipratropium-albuterol (DUONEB) 0.5-2.5 (3) MG/3ML nebulizer solution 3 mL (not administered)  albuterol (PROVENTIL) (2.5 MG/3ML) 0.083% nebulizer solution 2.5 mg (not administered)  predniSONE (DELTASONE) tablet 60 mg (not administered)     Initial Impression / Assessment and Plan / ED Course  I have reviewed the triage vital signs  and the nursing notes.  Pertinent labs & imaging results that were available during my care of the patient were reviewed by me and considered in my medical decision making (see chart for details).  Clinical Course     Pt has received prednisone, albuterol neb, lung sounds improving.  He is overall non-toxic appearing.  Vitals stable, no hypoxia.  He has ambulated in the dept without hypoxia or difficulty.  Appears stable for d/c, albuterol MDI dispensed, rx for zithromax.  Strict return precautions given.  Final Clinical Impressions(s) / ED Diagnoses   Final diagnoses:  Community acquired pneumonia of right lung, unspecified part of lung    New Prescriptions New Prescriptions   No medications on file     Pauline Ausammy Thad Osoria, PA-C 05/25/16 1938    Donnetta HutchingBrian Cook, MD 06/05/16 [phone]

## 2016-05-31 ENCOUNTER — Emergency Department (HOSPITAL_COMMUNITY): Payer: No Typology Code available for payment source

## 2016-05-31 ENCOUNTER — Encounter (HOSPITAL_COMMUNITY): Payer: Self-pay | Admitting: Emergency Medicine

## 2016-05-31 ENCOUNTER — Emergency Department (HOSPITAL_COMMUNITY)
Admission: EM | Admit: 2016-05-31 | Discharge: 2016-05-31 | Disposition: A | Discharge status: Home / Self Care | Payer: No Typology Code available for payment source | Attending: Emergency Medicine | Admitting: Emergency Medicine

## 2016-05-31 DIAGNOSIS — Z79899 Other long term (current) drug therapy: Secondary | ICD-10-CM | POA: Insufficient documentation

## 2016-05-31 DIAGNOSIS — S069X9A Unspecified intracranial injury with loss of consciousness of unspecified duration, initial encounter: Secondary | ICD-10-CM | POA: Insufficient documentation

## 2016-05-31 DIAGNOSIS — Y92410 Unspecified street and highway as the place of occurrence of the external cause: Secondary | ICD-10-CM | POA: Insufficient documentation

## 2016-05-31 DIAGNOSIS — F0781 Postconcussional syndrome: Secondary | ICD-10-CM | POA: Diagnosis not present

## 2016-05-31 DIAGNOSIS — F17210 Nicotine dependence, cigarettes, uncomplicated: Secondary | ICD-10-CM | POA: Insufficient documentation

## 2016-05-31 DIAGNOSIS — M1990 Unspecified osteoarthritis, unspecified site: Secondary | ICD-10-CM | POA: Diagnosis not present

## 2016-05-31 DIAGNOSIS — Y9389 Activity, other specified: Secondary | ICD-10-CM | POA: Diagnosis not present

## 2016-05-31 DIAGNOSIS — Y999 Unspecified external cause status: Secondary | ICD-10-CM | POA: Diagnosis not present

## 2016-05-31 DIAGNOSIS — I10 Essential (primary) hypertension: Secondary | ICD-10-CM | POA: Insufficient documentation

## 2016-05-31 DIAGNOSIS — S0990XA Unspecified injury of head, initial encounter: Secondary | ICD-10-CM | POA: Diagnosis present

## 2016-05-31 MED ORDER — IBUPROFEN 800 MG PO TABS: 800.0000 mg | ORAL_TABLET | Freq: Three times a day (TID) | ORAL | 0 refills | Status: DC

## 2016-05-31 MED ORDER — CYCLOBENZAPRINE HCL 10 MG PO TABS: 10.0000 mg | ORAL_TABLET | Freq: Two times a day (BID) | ORAL | 0 refills | Status: DC | PRN

## 2016-05-31 MED ORDER — TRAMADOL HCL 50 MG PO TABS: 50.0000 mg | ORAL_TABLET | Freq: Four times a day (QID) | ORAL | 0 refills | Status: DC | PRN

## 2016-05-31 MED ORDER — IBUPROFEN 800 MG PO TABS
800.0000 mg | ORAL_TABLET | Freq: Once | ORAL | Status: AC
  Administered 2016-05-31: 800 mg via ORAL
  Filled 2016-05-31: qty 1

## 2016-05-31 NOTE — ED Notes (Signed)
Pt alert & oriented x4, stable gait. Patient given discharge instructions, paperwork & prescription(s). Patient  instructed to stop at the registration desk to finish any additional paperwork. Patient verbalized understanding. Pt left department w/ no further questions. 
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
## 2016-06-14 ENCOUNTER — Emergency Department (HOSPITAL_COMMUNITY)
Admission: EM | Admit: 2016-06-14 | Discharge: 2016-06-14 | Disposition: A | Discharge status: Home / Self Care | Payer: Self-pay | Attending: Dermatology | Admitting: Dermatology

## 2016-06-14 ENCOUNTER — Encounter (HOSPITAL_COMMUNITY): Payer: Self-pay | Admitting: Emergency Medicine

## 2016-06-14 DIAGNOSIS — Z5321 Procedure and treatment not carried out due to patient leaving prior to being seen by health care provider: Secondary | ICD-10-CM | POA: Insufficient documentation

## 2016-06-14 DIAGNOSIS — Z79899 Other long term (current) drug therapy: Secondary | ICD-10-CM | POA: Insufficient documentation

## 2016-06-14 DIAGNOSIS — I10 Essential (primary) hypertension: Secondary | ICD-10-CM | POA: Insufficient documentation

## 2016-06-14 DIAGNOSIS — F17210 Nicotine dependence, cigarettes, uncomplicated: Secondary | ICD-10-CM | POA: Insufficient documentation

## 2016-06-14 LAB — CBC
HEMATOCRIT: 41.7 % (ref 39.0–52.0)
Hemoglobin: 13.5 g/dL (ref 13.0–17.0)
MCH: 31.5 pg (ref 26.0–34.0)
MCHC: 32.4 g/dL (ref 30.0–36.0)
MCV: 97.4 fL (ref 78.0–100.0)
PLATELETS: 175 10*3/uL (ref 150–400)
RBC: 4.28 MIL/uL (ref 4.22–5.81)
RDW: 13.7 % (ref 11.5–15.5)
WBC: 7.7 10*3/uL (ref 4.0–10.5)

## 2016-06-14 LAB — COMPREHENSIVE METABOLIC PANEL
ALT: 38 U/L (ref 17–63)
AST: 30 U/L (ref 15–41)
Albumin: 3.9 g/dL (ref 3.5–5.0)
Alkaline Phosphatase: 82 U/L (ref 38–126)
Anion gap: 6 (ref 5–15)
BILIRUBIN TOTAL: 0.5 mg/dL (ref 0.3–1.2)
BUN: 15 mg/dL (ref 6–20)
CO2: 28 mmol/L (ref 22–32)
Calcium: 9.3 mg/dL (ref 8.9–10.3)
Chloride: 104 mmol/L (ref 101–111)
Creatinine, Ser: 1.03 mg/dL (ref 0.61–1.24)
Glucose, Bld: 111 mg/dL — ABNORMAL HIGH (ref 65–99)
POTASSIUM: 3.7 mmol/L (ref 3.5–5.1)
Sodium: 138 mmol/L (ref 135–145)
TOTAL PROTEIN: 7.1 g/dL (ref 6.5–8.1)

## 2016-06-14 LAB — LIPASE, BLOOD: Lipase: 27 U/L (ref 11–51)

## 2016-06-14 NOTE — ED Triage Notes (Signed)
Pt c/o R side pain that is increased when he walks, coughs or sneezes. Pt denies known injury. Onset of symptoms 4-5 days ago.

## 2016-06-14 NOTE — ED Triage Notes (Signed)
Pt states he woke 2 nights ago and had an episode of emesis.

## 2016-06-14 NOTE — ED Notes (Signed)
Per registration pt left

## 2018-03-06 IMAGING — DX DG PELVIS 1-2V
1 series · 1 of 1 positions shown · non-contrast
Comparison: None.

CLINICAL DATA: Right low back pain after fall this morning.

EXAM:
PELVIS - 1-2 VIEW

[pelvis ap]
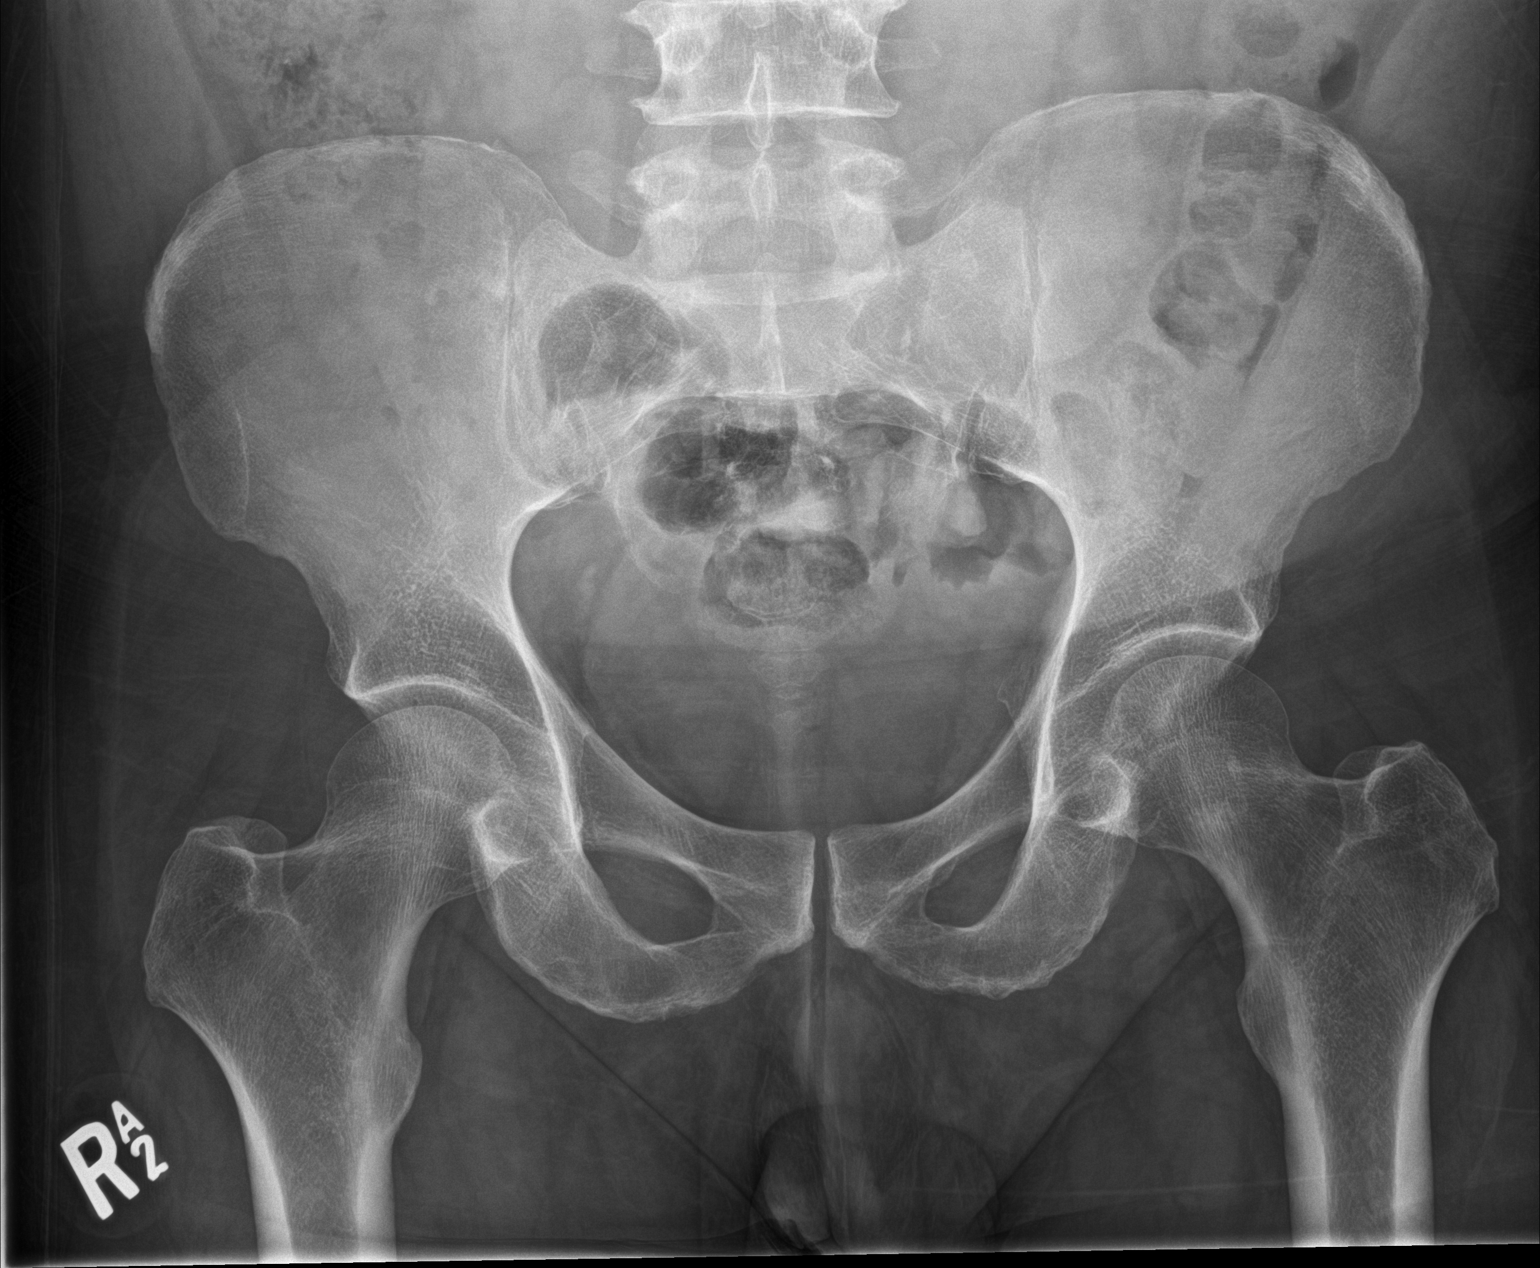

[1 of 1 positions shown; findings below may reference images not displayed]

FINDINGS: There is no evidence of pelvic fracture or diastasis. No pelvic bone
lesions are seen. SI joints and hip joints are symmetric and
unremarkable.
IMPRESSION: Negative.

## 2018-03-06 IMAGING — DX DG LUMBAR SPINE COMPLETE 4+V
5 series · 5 of 5 positions shown · non-contrast
Comparison: 07/06/2015

CLINICAL DATA: Low back pain predominantly on the right after fall
today.

EXAM:
LUMBAR SPINE - COMPLETE 4+ VIEW

[l-spine ap]
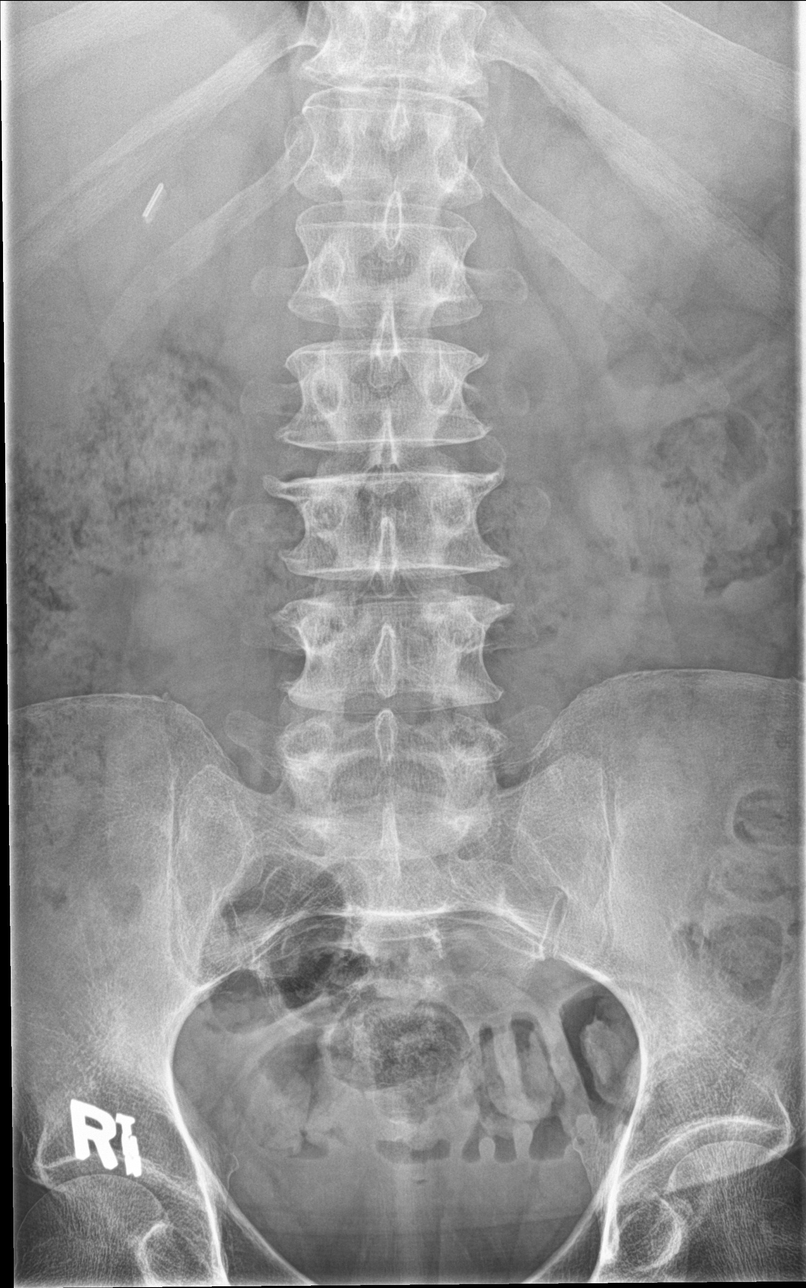

[l-spine obl (1 of 2)]
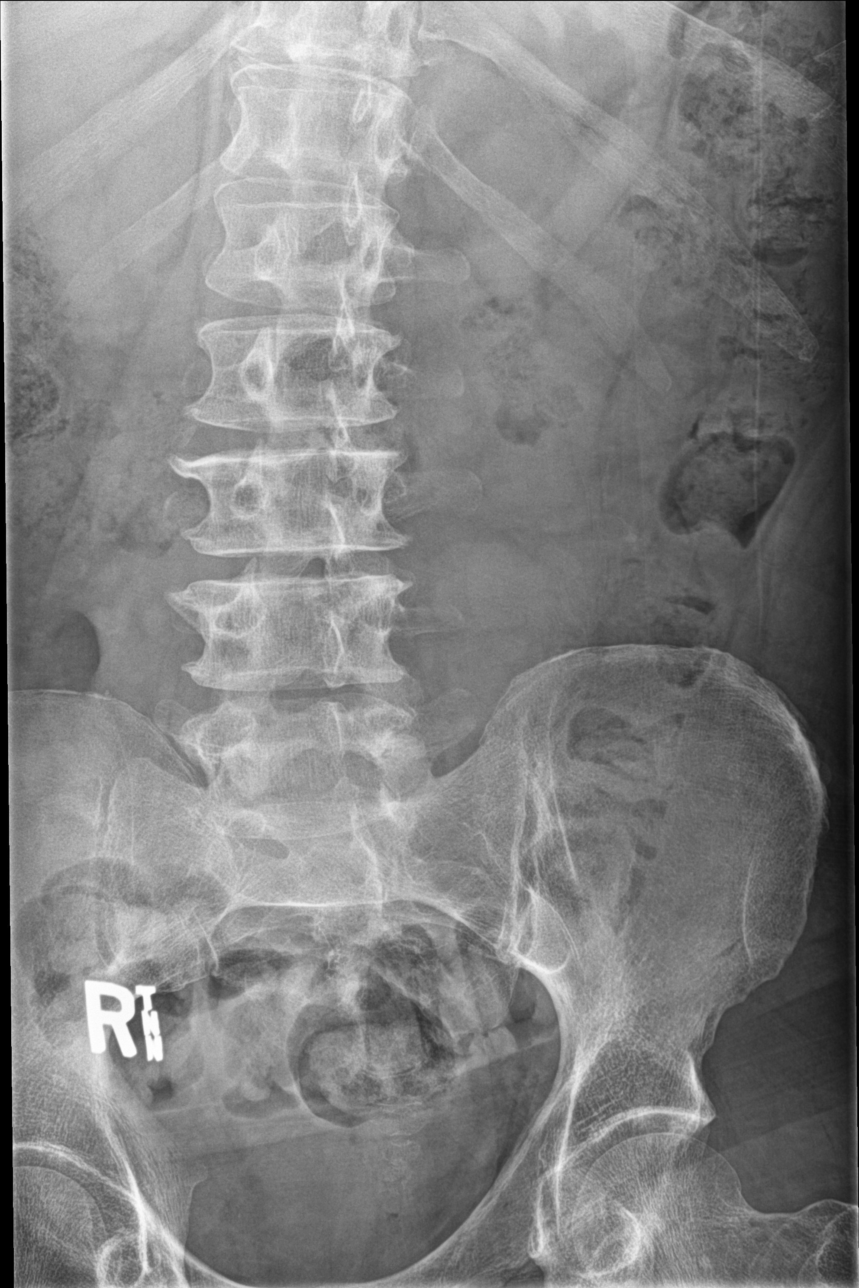

[l-spine obl (2 of 2)]
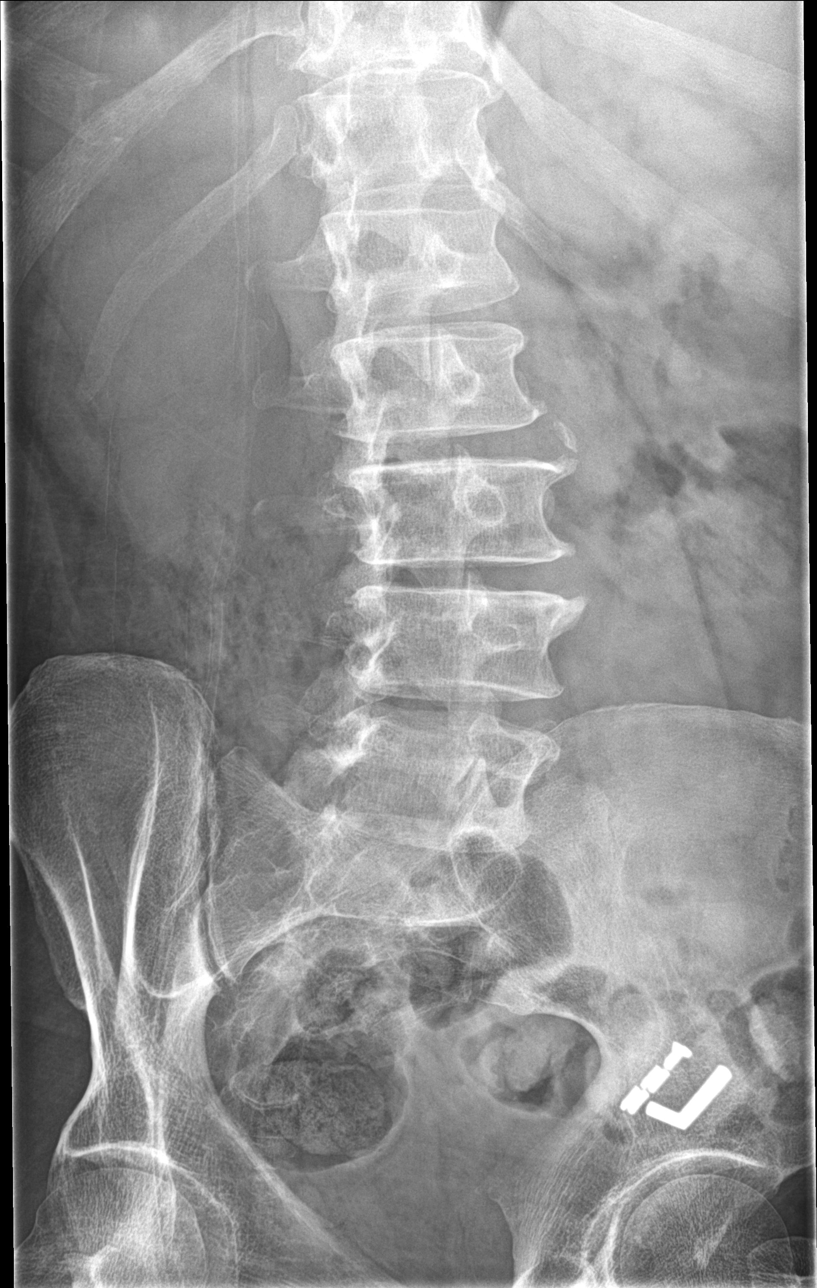

[l-spine lat]
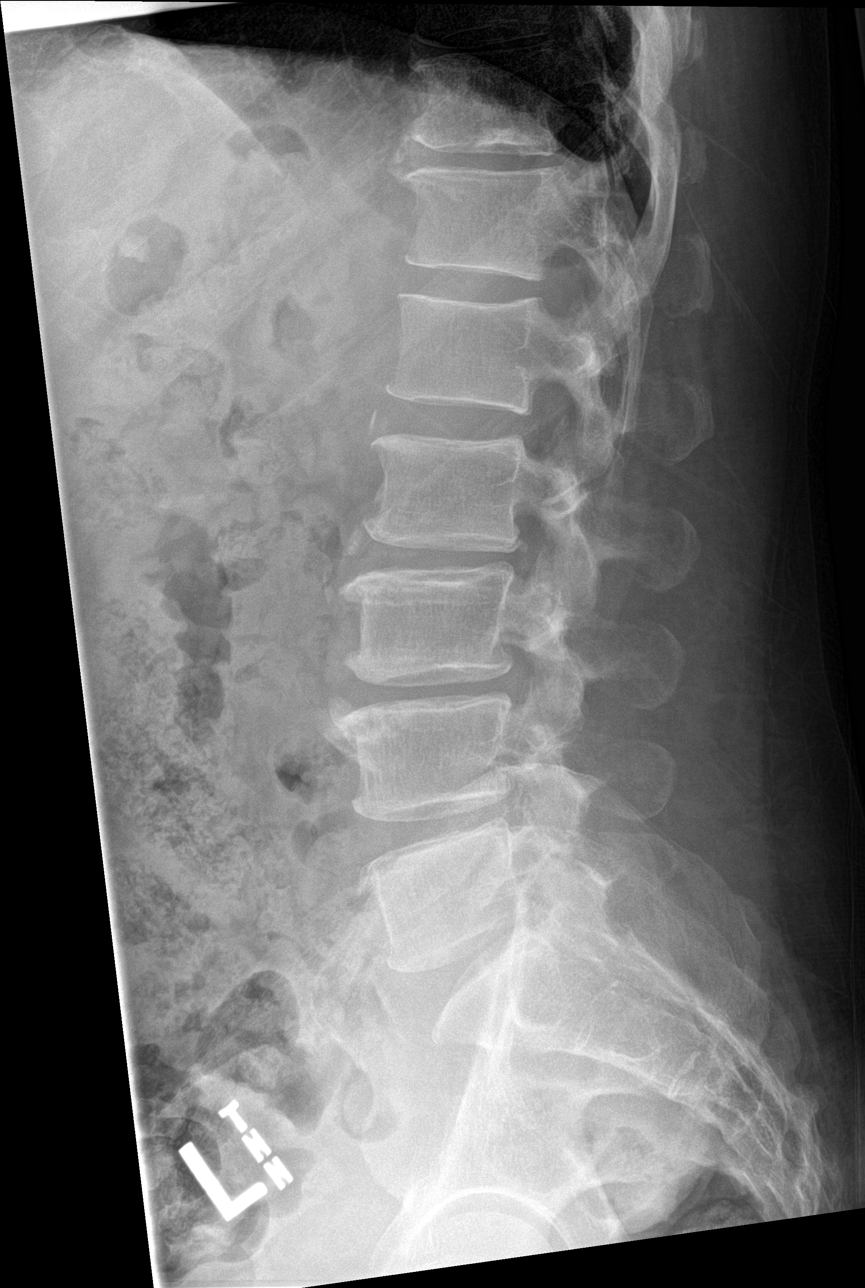

[l-spine spot]
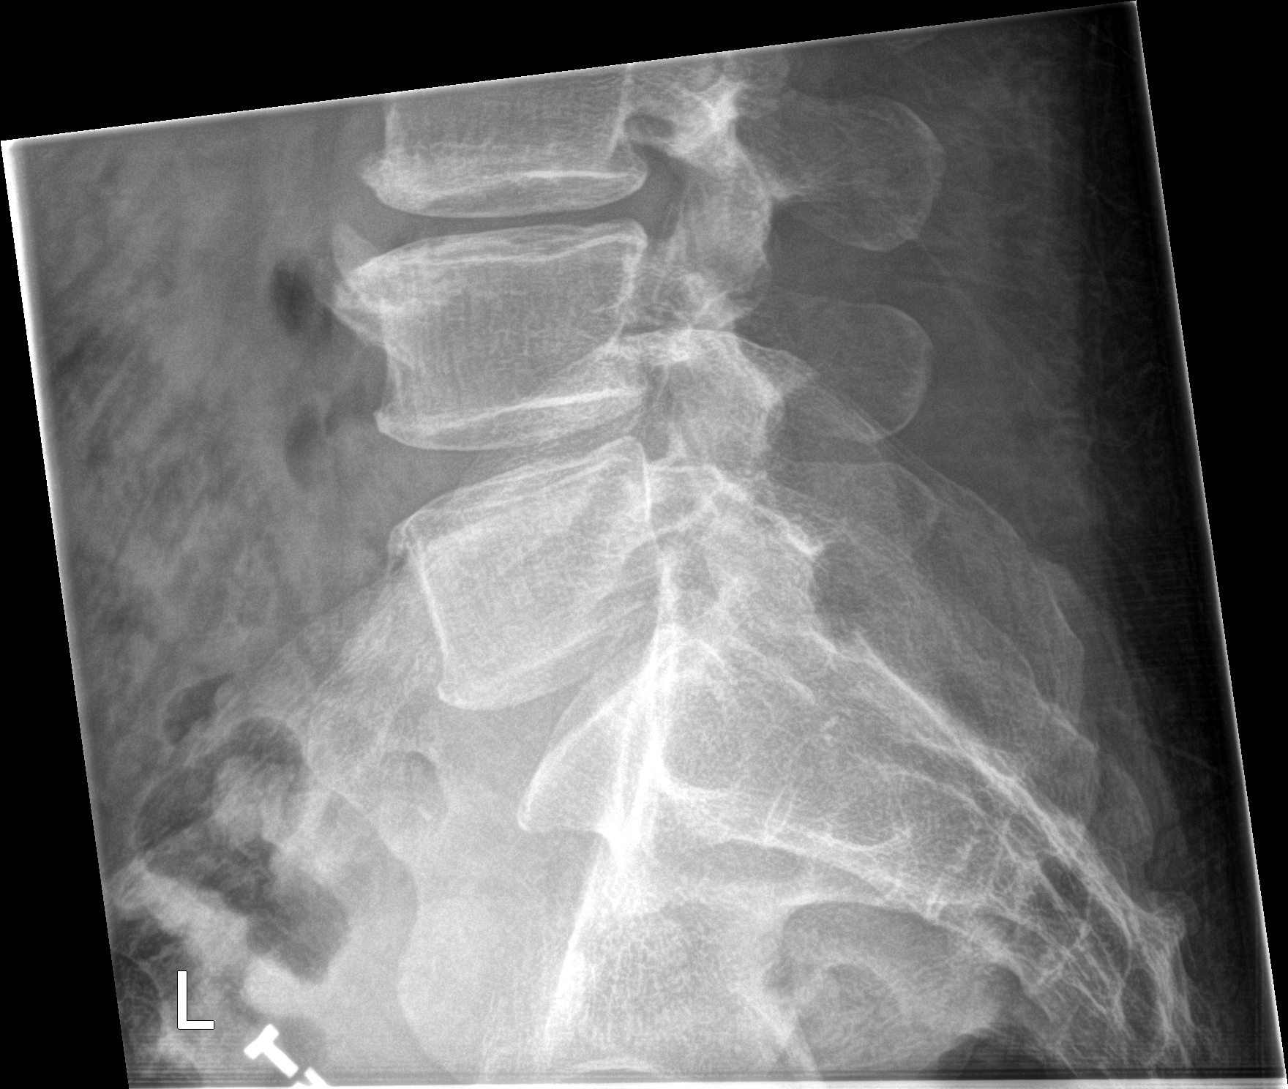

[5 of 5 positions shown; findings below may reference images not displayed]

FINDINGS: Mild anterior and lateral degenerative spurring. Disc spaces are
maintained. Normal alignment. No fracture. SI joints are symmetric
and unremarkable.
IMPRESSION: No acute bony abnormality.

## 2018-08-01 ENCOUNTER — Encounter (HOSPITAL_COMMUNITY): Payer: Self-pay

## 2018-08-01 ENCOUNTER — Emergency Department (HOSPITAL_COMMUNITY)
Admission: EM | Admit: 2018-08-01 | Discharge: 2018-08-01 | Disposition: A | Discharge status: Home / Self Care | Payer: Self-pay | Attending: Emergency Medicine | Admitting: Emergency Medicine

## 2018-08-01 ENCOUNTER — Other Ambulatory Visit: Payer: Self-pay

## 2018-08-01 DIAGNOSIS — I10 Essential (primary) hypertension: Secondary | ICD-10-CM | POA: Insufficient documentation

## 2018-08-01 DIAGNOSIS — F17210 Nicotine dependence, cigarettes, uncomplicated: Secondary | ICD-10-CM | POA: Insufficient documentation

## 2018-08-01 DIAGNOSIS — F1410 Cocaine abuse, uncomplicated: Secondary | ICD-10-CM | POA: Insufficient documentation

## 2018-08-01 DIAGNOSIS — F1210 Cannabis abuse, uncomplicated: Secondary | ICD-10-CM | POA: Insufficient documentation

## 2018-08-01 DIAGNOSIS — F1910 Other psychoactive substance abuse, uncomplicated: Secondary | ICD-10-CM

## 2018-08-01 HISTORY — DX: Patient's other noncompliance with medication regimen for other reason: Z91.148

## 2018-08-01 HISTORY — DX: Patient's other noncompliance with medication regimen: Z91.14

## 2018-08-01 LAB — CBC WITH DIFFERENTIAL/PLATELET
ABS IMMATURE GRANULOCYTES: 0.02 10*3/uL (ref 0.00–0.07)
BASOS PCT: 1 %
Basophils Absolute: 0 10*3/uL (ref 0.0–0.1)
Eosinophils Absolute: 0.3 10*3/uL (ref 0.0–0.5)
Eosinophils Relative: 5 %
HCT: 46.7 % (ref 39.0–52.0)
Hemoglobin: 15.2 g/dL (ref 13.0–17.0)
IMMATURE GRANULOCYTES: 0 %
LYMPHS PCT: 23 %
Lymphs Abs: 1.6 10*3/uL (ref 0.7–4.0)
MCH: 30.3 pg (ref 26.0–34.0)
MCHC: 32.5 g/dL (ref 30.0–36.0)
MCV: 93.2 fL (ref 80.0–100.0)
Monocytes Absolute: 0.6 10*3/uL (ref 0.1–1.0)
Monocytes Relative: 9 %
NEUTROS ABS: 4.2 10*3/uL (ref 1.7–7.7)
NEUTROS PCT: 62 %
PLATELETS: 195 10*3/uL (ref 150–400)
RBC: 5.01 MIL/uL (ref 4.22–5.81)
RDW: 13.3 % (ref 11.5–15.5)
WBC: 6.8 10*3/uL (ref 4.0–10.5)
nRBC: 0 % (ref 0.0–0.2)

## 2018-08-01 LAB — BASIC METABOLIC PANEL
ANION GAP: 7 (ref 5–15)
BUN: 11 mg/dL (ref 6–20)
CALCIUM: 9.2 mg/dL (ref 8.9–10.3)
CO2: 27 mmol/L (ref 22–32)
CREATININE: 0.8 mg/dL (ref 0.61–1.24)
Chloride: 104 mmol/L (ref 98–111)
GFR calc Af Amer: >60 mL/min (ref >60)
GFR calc non Af Amer: >60 mL/min (ref >60)
GLUCOSE: 128 mg/dL — AB (ref 70–99)
POTASSIUM: 3.3 mmol/L — AB (ref 3.5–5.1)
SODIUM: 138 mmol/L (ref 135–145)

## 2018-08-01 LAB — RAPID URINE DRUG SCREEN, HOSP PERFORMED
Amphetamines: POSITIVE — AB
BARBITURATES: NOT DETECTED
BENZODIAZEPINES: NOT DETECTED
Cocaine: POSITIVE — AB
Opiates: POSITIVE — AB
Tetrahydrocannabinol: POSITIVE — AB

## 2018-08-01 MED ORDER — CLONIDINE HCL 0.2 MG PO TABS
0.2000 mg | ORAL_TABLET | Freq: Once | ORAL | Status: AC
  Administered 2018-08-01: 0.2 mg via ORAL
  Filled 2018-08-01: qty 1

## 2018-08-01 MED ORDER — ACETAMINOPHEN 325 MG PO TABS
650.0000 mg | ORAL_TABLET | Freq: Four times a day (QID) | ORAL | Status: DC | PRN
  Administered 2018-08-01: 650 mg via ORAL
  Filled 2018-08-01: qty 2

## 2018-08-01 MED ORDER — CLONIDINE HCL 0.1 MG PO TABS: 0.1000 mg | ORAL_TABLET | Freq: Two times a day (BID) | ORAL | 11 refills | Status: DC

## 2018-08-01 MED ORDER — LISINOPRIL 10 MG PO TABS: 10.0000 mg | ORAL_TABLET | Freq: Every day | ORAL | 0 refills | Status: AC

## 2018-08-01 MED ORDER — CLONIDINE HCL 0.1 MG PO TABS: 0.1000 mg | ORAL_TABLET | Freq: Two times a day (BID) | ORAL | 0 refills | Status: AC

## 2018-08-01 MED ORDER — LISINOPRIL 10 MG PO TABS
10.0000 mg | ORAL_TABLET | Freq: Once | ORAL | Status: AC
  Administered 2018-08-01: 10 mg via ORAL
  Filled 2018-08-01: qty 1

## 2018-08-01 NOTE — ED Triage Notes (Signed)
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  Reports clinic instructed him to get his bp under control before they will accept him.  Pt went to health dept because they gave him lisinopril years ago but wouldn't treat him today because his bp was too high.  Pt says he stopped the bp medication 1 week after he started it because it made him dizzy.  Pt denies symptoms.

## 2018-08-01 NOTE — ED Notes (Signed)
Pt instructed to take HTN medications and stop using drugs.

## 2018-08-01 NOTE — ED Notes (Signed)
ED Provider at bedside. 

## 2018-08-01 NOTE — ED Provider Notes (Signed)
[facility] EMERGENCY DEPARTMENT Provider Note   CSN: [id number] Arrival date & time: 08/01/18  4446     History   Chief Complaint Chief Complaint  Patient presents with  . Hypertension    HPI Gerald Moore Hageman. is a 48 y.o. male.  The history is provided by the patient. No language interpreter was used.  Hypertension  This is a new problem. The current episode started more than 2 days ago. The problem occurs constantly. Nothing aggravates the symptoms. Nothing relieves the symptoms. He has tried nothing for the symptoms. The treatment provided no relief.  Pt went to health dept for blood pressure treatment so that he can get suboxone.  Pt was sent here   Past Medical History:  Diagnosis Date  . Chronic back pain   . Hepatitis C   . Hx of medication noncompliance   . Hypertension   . Polysubstance abuse (HCC)    benzos, opiates, cocaine, mariuana    Patient Active Problem List   Diagnosis Date Noted  . Acute on chronic cholecystitis 04/28/2015  . Leukocytosis 04/28/2015  . Tobacco abuse 04/28/2015  . Acute cholecystitis 04/28/2015    Past Surgical History:  Procedure Laterality Date  . CHOLECYSTECTOMY N/A 04/30/2015   Procedure: LAPAROSCOPIC CHOLECYSTECTOMY;  Surgeon: Franky Macho, MD;  Location: [facility];  Service: General;  Laterality: N/A;  . HERNIA REPAIR          Home Medications    Prior to Admission medications   Medication Sig Start Date End Date Taking? Authorizing Provider  cloNIDine (CATAPRES) 0.1 MG tablet Take 1 tablet (0.1 mg total) by mouth 2 (two) times daily. 08/01/18 08/01/19  Elson Areas, PA-C  lisinopril (PRINIVIL,ZESTRIL) 10 MG tablet Take 1 tablet (10 mg total) by mouth daily. 08/01/18 08/01/19  Elson Areas, PA-C    Family History No family history on file.  Social History Social History   Tobacco Use  . Smoking status: Current Every Day Smoker    Packs/day: 1.50    Types: Cigarettes  . Smokeless tobacco: Never Used  Substance  Use Topics  . Alcohol use: No  . Drug use: Yes    Types: Marijuana, Cocaine    Comment: pt denies     Allergies   Patient has no known allergies.   Review of Systems Review of Systems  All other systems reviewed and are negative.    Physical Exam Updated Vital Signs BP (!) 163/116   Pulse 68   Temp 98.5 F (36.9 C) (Oral)   Resp 16   SpO2 98%   Physical Exam Vitals signs and nursing note reviewed.  Constitutional:      Appearance: He is well-developed.  HENT:     Head: Normocephalic.     Nose: Nose normal.  Eyes:     Pupils: Pupils are equal, round, and reactive to light.  Neck:     Musculoskeletal: Normal range of motion.  Cardiovascular:     Rate and Rhythm: Normal rate.  Pulmonary:     Effort: Pulmonary effort is normal.  Abdominal:     General: There is no distension.  Musculoskeletal: Normal range of motion.  Skin:    General: Skin is warm.  Neurological:     Mental Status: He is alert and oriented to person, place, and time.  Psychiatric:        Mood and Affect: Mood normal.      ED Treatments / Results  Labs (all labs ordered are listed,  but only abnormal results are displayed) Labs Reviewed  BASIC METABOLIC PANEL - Abnormal; Notable for the following components:      Result Value   Potassium 3.3 (*)    Glucose, Bld 128 (*)    All other components within normal limits  RAPID URINE DRUG SCREEN, HOSP PERFORMED - Abnormal; Notable for the following components:   Opiates POSITIVE (*)    Cocaine POSITIVE (*)    Amphetamines POSITIVE (*)    Tetrahydrocannabinol POSITIVE (*)    All other components within normal limits  CBC WITH DIFFERENTIAL/PLATELET    EKG EKG Interpretation  Date/Time:  Thursday August 01 2018 10:01:40 EST Ventricular Rate:  74 PR Interval:    QRS Duration: 94 QT Interval:  393 QTC Calculation: 436 R Axis:   44 Text Interpretation:  Sinus rhythm Probable anteroseptal infarct, old Since last tracing of earlier  today No significant change was found Confirmed by Samuel Jester [phone]) on 08/01/2018 10:07:20 AM   Radiology No results found.  Procedures Procedures (including critical care time)  Medications Ordered in ED Medications  lisinopril (PRINIVIL,ZESTRIL) tablet 10 mg (10 mg Oral Given 08/01/18 1018)  cloNIDine (CATAPRES) tablet 0.2 mg (0.2 mg Oral Given 08/01/18 1125)     Initial Impression / Assessment and Plan / ED Course  I have reviewed the triage vital signs and the nursing notes.  Pertinent labs & imaging results that were available during my care of the patient were reviewed by me and considered in my medical decision making (see chart for details).     Pt given lisionopril.   Pt most likely having some sithdrawl.   Pt given rx for clonidine as well.    Final Clinical Impressions(s) / ED Diagnoses   Final diagnoses:  Hypertension, unspecified type  Substance abuse [facility])    ED Discharge Orders         Ordered    lisinopril (PRINIVIL,ZESTRIL) 10 MG tablet  Daily     08/01/18 1245    cloNIDine (CATAPRES) 0.1 MG tablet  2 times daily,   Status:  Discontinued     08/01/18 1245    cloNIDine (CATAPRES) 0.1 MG tablet  2 times daily     08/01/18 1246        An After Visit Summary was printed and given to the patient.    Elson Areas, PA-C 08/01/18 1642    Samuel Jester, DO 08/03/18 1540

## 2018-09-12 ENCOUNTER — Emergency Department (HOSPITAL_COMMUNITY)
Admission: EM | Admit: 2018-09-12 | Discharge: 2018-09-12 | Disposition: A | Discharge status: Home / Self Care | Payer: Self-pay | Attending: Emergency Medicine | Admitting: Emergency Medicine

## 2018-09-12 ENCOUNTER — Emergency Department (HOSPITAL_COMMUNITY): Payer: Self-pay

## 2018-09-12 ENCOUNTER — Encounter (HOSPITAL_COMMUNITY): Payer: Self-pay | Admitting: *Deleted

## 2018-09-12 ENCOUNTER — Other Ambulatory Visit: Payer: Self-pay

## 2018-09-12 DIAGNOSIS — I10 Essential (primary) hypertension: Secondary | ICD-10-CM | POA: Insufficient documentation

## 2018-09-12 DIAGNOSIS — J09X2 Influenza due to identified novel influenza A virus with other respiratory manifestations: Secondary | ICD-10-CM | POA: Insufficient documentation

## 2018-09-12 DIAGNOSIS — F17210 Nicotine dependence, cigarettes, uncomplicated: Secondary | ICD-10-CM | POA: Insufficient documentation

## 2018-09-12 DIAGNOSIS — Z79899 Other long term (current) drug therapy: Secondary | ICD-10-CM | POA: Insufficient documentation

## 2018-09-12 DIAGNOSIS — J111 Influenza due to unidentified influenza virus with other respiratory manifestations: Secondary | ICD-10-CM

## 2018-09-12 LAB — INFLUENZA PANEL BY PCR (TYPE A & B)
INFLAPCR: POSITIVE — AB
INFLBPCR: NEGATIVE

## 2018-09-12 MED ORDER — OSELTAMIVIR PHOSPHATE 75 MG PO CAPS: 75.0000 mg | ORAL_CAPSULE | Freq: Two times a day (BID) | ORAL | 0 refills | Status: AC

## 2018-09-12 NOTE — ED Triage Notes (Addendum)
Pt c/o n/v, SOB, dry cough, fever x 3 days. Pt reports decreased oral intake in the last 3 days until today. Pt reports this is the first day he has been able to get out of bed. Pt reports he is feeling about "30 to 40% better". Pt reports he is here today because he goes to a Methadone clinic and they will not let him come in tomorrow to dose until he is cleared from the coronavirus. Pt reports his nephew and granddaughter had had the flu and he has been around them recently. Pt reports he took 2 Tylenol at 1300 today.

## 2018-09-12 NOTE — ED Provider Notes (Signed)
[facility] EMERGENCY DEPARTMENT Provider Note   CSN: [id number] Arrival date & time: 09/12/18  1254    History   Chief Complaint Chief Complaint  Patient presents with  . Shortness of Breath    HPI Gerald J Bornemann Montez Hageman. is a 48 y.o. male.     Patient is a 48 year old male who presents to the emergency department concern emergency department with a complaint of cough and shortness of breath.  Patient states that he has been sick for the last 3 days.  He has had problems with shortness of breath, cough, vomiting, aching.  He says that he actually feels a little better today.  He has had a very poor appetite during these first 3 to 3 days, but feels as though he can eat something today.  He also has been able to drink fluids today.  The patient request to be evaluated for the symptoms.  The patient also requests coronavirus testing because he is going to a methadone clinic.  He says they would not allow him to dose until he is cleared for coronavirus.  The patient states he has not traveled outside of the country.  He is not been to any of the hotspots within the [place].  He has not been around anyone with known exposure to coronavirus.  The history is provided by the patient.  Shortness of Breath  Associated symptoms: cough, fever, sore throat and vomiting   Associated symptoms: no abdominal pain, no chest pain, no neck pain and no wheezing     Past Medical History:  Diagnosis Date  . Chronic back pain   . Hepatitis C   . Hx of medication noncompliance   . Hypertension   . Polysubstance abuse (HCC)    benzos, opiates, cocaine, mariuana    Patient Active Problem List   Diagnosis Date Noted  . Acute on chronic cholecystitis 04/28/2015  . Leukocytosis 04/28/2015  . Tobacco abuse 04/28/2015  . Acute cholecystitis 04/28/2015    Past Surgical History:  Procedure Laterality Date  . CHOLECYSTECTOMY N/A 04/30/2015   Procedure: LAPAROSCOPIC CHOLECYSTECTOMY;  Surgeon: Franky Macho, MD;  Location: [facility];  Service: General;  Laterality: N/A;  . HERNIA REPAIR          Home Medications    Prior to Admission medications   Medication Sig Start Date End Date Taking? Authorizing Provider  cloNIDine (CATAPRES) 0.1 MG tablet Take 1 tablet (0.1 mg total) by mouth 2 (two) times daily. 08/01/18 08/01/19  Elson Areas, PA-C  lisinopril (PRINIVIL,ZESTRIL) 10 MG tablet Take 1 tablet (10 mg total) by mouth daily. 08/01/18 08/01/19  Elson Areas, PA-C    Family History No family history on file.  Social History Social History   Tobacco Use  . Smoking status: Current Every Day Smoker    Packs/day: 2.00    Types: Cigarettes  . Smokeless tobacco: Never Used  Substance Use Topics  . Alcohol use: No  . Drug use: Yes    Types: Marijuana, Cocaine    Comment: heroin last used 4 days ago as of 09/12/18     Allergies   Patient has no known allergies.   Review of Systems Review of Systems  Constitutional: Positive for appetite change and fever. Negative for activity change.       All ROS Neg except as noted in HPI  HENT: Positive for congestion and sore throat. Negative for nosebleeds.   Eyes: Negative for photophobia and discharge.  Respiratory: Positive for  cough and shortness of breath. Negative for wheezing.   Cardiovascular: Negative for chest pain and palpitations.  Gastrointestinal: Positive for nausea and vomiting. Negative for abdominal pain and blood in stool.  Genitourinary: Negative for dysuria, frequency and hematuria.  Musculoskeletal: Negative for arthralgias, back pain and neck pain.  Skin: Negative.   Neurological: Negative for dizziness, seizures and speech difficulty.  Psychiatric/Behavioral: Negative for confusion and hallucinations.     Physical Exam Updated Vital Signs BP 110/86 (BP Location: Left Arm)   Pulse (!) 106   Temp 100 F (37.8 C) (Oral)   Resp 16   Ht  (1.651 m)   Wt 72.6 kg   BMI 26.63 kg/m   Physical Exam  Vitals signs and nursing note reviewed.  Constitutional:      Appearance: He is well-developed. He is not toxic-appearing.  HENT:     Head: Normocephalic.     Right Ear: Tympanic membrane and external ear normal.     Left Ear: Tympanic membrane and external ear normal.     Nose: Congestion present.  Eyes:     General: Lids are normal.     Pupils: Pupils are equal, round, and reactive to light.  Neck:     Musculoskeletal: Normal range of motion and neck supple.     Vascular: No carotid bruit.  Cardiovascular:     Rate and Rhythm: Normal rate and regular rhythm.     Pulses: Normal pulses.     Heart sounds: Normal heart sounds.  Pulmonary:     Effort: No respiratory distress.     Breath sounds: Normal breath sounds.     Comments: Occasional soft wheeze There is symmetrical rise of the chest.  The patient speaks in complete sentences without problem. Abdominal:     General: Bowel sounds are normal.     Palpations: Abdomen is soft.     Tenderness: There is no abdominal tenderness. There is no guarding.  Musculoskeletal: Normal range of motion.  Lymphadenopathy:     Head:     Right side of head: No submandibular adenopathy.     Left side of head: No submandibular adenopathy.     Cervical: No cervical adenopathy.  Skin:    General: Skin is warm and dry.  Neurological:     Mental Status: He is alert and oriented to person, place, and time.     Cranial Nerves: No cranial nerve deficit.     Sensory: No sensory deficit.  Psychiatric:        Speech: Speech normal.      ED Treatments / Results  Labs (all labs ordered are listed, but only abnormal results are displayed) Labs Reviewed - No data to display  EKG None  Radiology No results found.  Procedures Procedures (including critical care time)  Medications Ordered in ED Medications - No data to display   Initial Impression / Assessment and Plan / ED Course  I have reviewed the triage vital signs and the nursing  notes.  Pertinent labs & imaging results that were available during my care of the patient were reviewed by me and considered in my medical decision making (see chart for details).          Final Clinical Impressions(s) / ED Diagnoses MDM  Vital signs reviewed.  Temperature is 100, pulse rate 106-elevated on admission to the emergency department.  The patient speaks in complete sentences.  He says he is feeling some better today than he had had been over  the previous 3 days.  He has not been out of the country recently.  He has not visited any of the hotspots within the [place].  He has not been exposed to anyone with known coronavirus.  Influenza test is positive.  Chest x-ray is negative.  Patient is ambulatory in the room, as well as in the hall without problem.  I have asked the patient to increase fluids.  The patient will wash hands frequently, and have asked him to have the family to wash hands frequently.  Prescription for Tamiflu given to the patient to use.  The patient is to follow-up with his primary physician or return to the emergency department if any changes in condition, problems, or concerns.   Final diagnoses:  Influenza    ED Discharge Orders         Ordered    oseltamivir (TAMIFLU) 75 MG capsule  Every 12 hours     09/12/18 1853           Ivery Quale, PA-C 09/12/18 1904    Terrilee Files, MD 09/13/18 1420

## 2018-09-12 NOTE — Discharge Instructions (Addendum)
Your chest x-ray shows some scar tissue from previous illnesses, but no pneumonia, no collapsing of your lung, no mass, and no extra fluid.  Your influenza test is positive.  Please use a mask until symptoms have resolved.  Please wash hands frequently.  Please have everyone in your home wash hands frequently.  Please increase fluids.  Use Tylenol every 4 hours, or ibuprofen every 6 hours for fever, and/or aching.  Please see your primary physician for additional symptoms, or changes in your condition.  Please return to the emergency department if any emergent changes, problems, or concerns.

## 2018-09-30 ENCOUNTER — Encounter (HOSPITAL_COMMUNITY): Payer: Self-pay | Admitting: Emergency Medicine

## 2018-09-30 ENCOUNTER — Emergency Department (HOSPITAL_COMMUNITY)
Admission: EM | Admit: 2018-09-30 | Discharge: 2018-09-30 | Disposition: A | Discharge status: Home / Self Care | Payer: No Typology Code available for payment source | Attending: Emergency Medicine | Admitting: Emergency Medicine

## 2018-09-30 ENCOUNTER — Other Ambulatory Visit: Payer: Self-pay

## 2018-09-30 DIAGNOSIS — M79642 Pain in left hand: Secondary | ICD-10-CM | POA: Diagnosis not present

## 2018-09-30 DIAGNOSIS — S299XXA Unspecified injury of thorax, initial encounter: Secondary | ICD-10-CM | POA: Diagnosis present

## 2018-09-30 DIAGNOSIS — T148XXA Other injury of unspecified body region, initial encounter: Secondary | ICD-10-CM

## 2018-09-30 DIAGNOSIS — Y999 Unspecified external cause status: Secondary | ICD-10-CM | POA: Insufficient documentation

## 2018-09-30 DIAGNOSIS — Y929 Unspecified place or not applicable: Secondary | ICD-10-CM | POA: Diagnosis not present

## 2018-09-30 DIAGNOSIS — S29012A Strain of muscle and tendon of back wall of thorax, initial encounter: Secondary | ICD-10-CM | POA: Insufficient documentation

## 2018-09-30 DIAGNOSIS — I10 Essential (primary) hypertension: Secondary | ICD-10-CM | POA: Insufficient documentation

## 2018-09-30 DIAGNOSIS — F17210 Nicotine dependence, cigarettes, uncomplicated: Secondary | ICD-10-CM | POA: Insufficient documentation

## 2018-09-30 DIAGNOSIS — L089 Local infection of the skin and subcutaneous tissue, unspecified: Secondary | ICD-10-CM

## 2018-09-30 DIAGNOSIS — Y9389 Activity, other specified: Secondary | ICD-10-CM | POA: Diagnosis not present

## 2018-09-30 DIAGNOSIS — Z8739 Personal history of other diseases of the musculoskeletal system and connective tissue: Secondary | ICD-10-CM | POA: Diagnosis not present

## 2018-09-30 MED ORDER — MUPIROCIN 2 % EX OINT
TOPICAL_OINTMENT | Freq: Once | CUTANEOUS | Status: AC
  Administered 2018-09-30: 19:00:00 via TOPICAL
  Filled 2018-09-30: qty 22

## 2018-09-30 MED ORDER — DOXYCYCLINE HYCLATE 100 MG PO TABS
100.0000 mg | ORAL_TABLET | Freq: Once | ORAL | Status: AC
  Administered 2018-09-30: 19:00:00 100 mg via ORAL
  Filled 2018-09-30: qty 1

## 2018-09-30 MED ORDER — METHOCARBAMOL 500 MG PO TABS
500.0000 mg | ORAL_TABLET | Freq: Once | ORAL | Status: AC
  Administered 2018-09-30: 500 mg via ORAL
  Filled 2018-09-30: qty 1

## 2018-09-30 MED ORDER — SULFAMETHOXAZOLE-TRIMETHOPRIM 800-160 MG PO TABS: 1.0000 | ORAL_TABLET | Freq: Two times a day (BID) | ORAL | 0 refills | Status: AC

## 2018-09-30 MED ORDER — METHOCARBAMOL 500 MG PO TABS: 500.0000 mg | ORAL_TABLET | Freq: Three times a day (TID) | ORAL | 0 refills | Status: DC

## 2018-09-30 MED ORDER — METHOCARBAMOL 500 MG PO TABS: 500.0000 mg | ORAL_TABLET | Freq: Three times a day (TID) | ORAL | 0 refills | Status: AC
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
## 2018-09-30 NOTE — Discharge Instructions (Addendum)
Please use antibiotic ointment to your wound of the hand.  Use Bactrim DS 2 times daily with food.  Your examination suggest muscle strain.  Please use Robaxin 3 times daily for muscle pain.  Continue your current medications.  Warm tub soaks may also be helpful for the area between your shoulders and your lower back.

## 2019-07-30 ENCOUNTER — Other Ambulatory Visit: Payer: Self-pay | Admitting: *Deleted

## 2019-07-30 DIAGNOSIS — R221 Localized swelling, mass and lump, neck: Secondary | ICD-10-CM

## 2019-08-04 ENCOUNTER — Other Ambulatory Visit: Payer: Self-pay

## 2019-08-04 ENCOUNTER — Ambulatory Visit (HOSPITAL_COMMUNITY)
Admission: RE | Admit: 2019-08-04 | Discharge: 2019-08-04 | Disposition: A | Discharge status: Home / Self Care | Payer: Self-pay | Source: Ambulatory Visit | Attending: *Deleted | Admitting: *Deleted

## 2019-08-04 DIAGNOSIS — R221 Localized swelling, mass and lump, neck: Secondary | ICD-10-CM | POA: Insufficient documentation

## 2020-01-15 ENCOUNTER — Encounter (HOSPITAL_COMMUNITY): Payer: Self-pay | Admitting: Emergency Medicine

## 2020-01-15 ENCOUNTER — Other Ambulatory Visit: Payer: Self-pay

## 2020-01-15 ENCOUNTER — Emergency Department (HOSPITAL_COMMUNITY)
Admission: EM | Admit: 2020-01-15 | Discharge: 2020-01-15 | Disposition: A | Discharge status: Home / Self Care | Payer: Self-pay | Attending: Emergency Medicine | Admitting: Emergency Medicine

## 2020-01-15 DIAGNOSIS — I10 Essential (primary) hypertension: Secondary | ICD-10-CM | POA: Insufficient documentation

## 2020-01-15 DIAGNOSIS — L0291 Cutaneous abscess, unspecified: Secondary | ICD-10-CM

## 2020-01-15 DIAGNOSIS — F17210 Nicotine dependence, cigarettes, uncomplicated: Secondary | ICD-10-CM | POA: Insufficient documentation

## 2020-01-15 DIAGNOSIS — Z79899 Other long term (current) drug therapy: Secondary | ICD-10-CM | POA: Insufficient documentation

## 2020-01-15 DIAGNOSIS — L02413 Cutaneous abscess of right upper limb: Secondary | ICD-10-CM | POA: Insufficient documentation

## 2020-01-15 MED ORDER — LIDOCAINE-EPINEPHRINE (PF) 2 %-1:200000 IJ SOLN: 10.0000 mL | Freq: Once | INTRAMUSCULAR | Status: AC

## 2020-01-15 MED ORDER — IBUPROFEN 400 MG PO TABS
600.0000 mg | ORAL_TABLET | Freq: Once | ORAL | Status: AC
  Administered 2020-01-15: 600 mg via ORAL
  Filled 2020-01-15: qty 2

## 2020-01-15 MED ORDER — LIDOCAINE-EPINEPHRINE 1 %-1:100000 IJ SOLN
10.0000 mL | Freq: Once | INTRAMUSCULAR | Status: DC
  Filled 2020-01-15: qty 10

## 2020-01-15 MED ORDER — SULFAMETHOXAZOLE-TRIMETHOPRIM 800-160 MG PO TABS: 1.0000 | ORAL_TABLET | Freq: Two times a day (BID) | ORAL | 0 refills | Status: AC

## 2020-01-15 MED ORDER — LIDOCAINE-EPINEPHRINE (PF) 2 %-1:200000 IJ SOLN
INTRAMUSCULAR | Status: AC
  Administered 2020-01-15: 10 mL
  Filled 2020-01-15: qty 20

## 2020-01-15 MED ORDER — SULFAMETHOXAZOLE-TRIMETHOPRIM 800-160 MG PO TABS
1.0000 | ORAL_TABLET | Freq: Once | ORAL | Status: AC
  Administered 2020-01-15: 1 via ORAL
  Filled 2020-01-15: qty 1

## 2020-01-15 MED ORDER — ONDANSETRON 4 MG PO TBDP
4.0000 mg | ORAL_TABLET | Freq: Once | ORAL | Status: AC
  Administered 2020-01-15: 4 mg via ORAL
  Filled 2020-01-15: qty 1

## 2020-01-15 NOTE — ED Triage Notes (Signed)
Pt has large abscess to right forearm "from a miss when shooting heroin", pt reports the needle had been used previously but only by him; pt also requests drug rehab

## 2020-01-19 NOTE — ED Provider Notes (Signed)
[facility] EMERGENCY DEPARTMENT Provider Note   CSN: [id number] Arrival date & time: 01/15/20  1312     History Chief Complaint  Patient presents with  . Abscess  . Addiction Problem    Gerald J Remus Montez Hageman. is a 49 y.o. male.  HPI   49 year old male with an abscess to his right forearm.  Does use IV drugs.  Began having a painful lesion on his arm a few days ago.  Increasing in size and more painful.  Some surrounding redness.  No drainage.  No fevers or chills.  Requesting detox.  Past Medical History:  Diagnosis Date  . Chronic back pain   . Hepatitis C   . Hx of medication noncompliance   . Hypertension   . Polysubstance abuse (HCC)    benzos, opiates, cocaine, mariuana    Patient Active Problem List   Diagnosis Date Noted  . Acute on chronic cholecystitis 04/28/2015  . Leukocytosis 04/28/2015  . Tobacco abuse 04/28/2015  . Acute cholecystitis 04/28/2015    Past Surgical History:  Procedure Laterality Date  . CHOLECYSTECTOMY N/A 04/30/2015   Procedure: LAPAROSCOPIC CHOLECYSTECTOMY;  Surgeon: Franky Macho, MD;  Location: [facility];  Service: General;  Laterality: N/A;  . HERNIA REPAIR         No family history on file.  Social History   Tobacco Use  . Smoking status: Current Every Day Smoker    Packs/day: 2.00    Types: Cigarettes  . Smokeless tobacco: Never Used  Vaping Use  . Vaping Use: Former  Substance Use Topics  . Alcohol use: No  . Drug use: Yes    Types: Marijuana, Cocaine    Comment: heroin last used last night     Home Medications Prior to Admission medications   Medication Sig Start Date End Date Taking? Authorizing Provider  cloNIDine (CATAPRES) 0.1 MG tablet Take 1 tablet (0.1 mg total) by mouth 2 (two) times daily. 08/01/18 08/01/19  Elson Areas, PA-C  lisinopril (PRINIVIL,ZESTRIL) 10 MG tablet Take 1 tablet (10 mg total) by mouth daily. 08/01/18 08/01/19  Elson Areas, PA-C  methocarbamol (ROBAXIN) 500 MG tablet Take 1 tablet (500 mg  total) by mouth 3 (three) times daily. 09/30/18   Ivery Quale, PA-C  oseltamivir (TAMIFLU) 75 MG capsule Take 1 capsule (75 mg total) by mouth every 12 (twelve) hours. Patient not taking: Reported on 09/30/2018 09/12/18   Ivery Quale, PA-C  sulfamethoxazole-trimethoprim (BACTRIM DS) 800-160 MG tablet Take 1 tablet by mouth 2 (two) times daily for 5 days. 01/15/20 01/20/20  Raeford Razor, MD    Allergies    Patient has no known allergies.  Review of Systems   Review of Systems All systems reviewed and negative, other than as noted in HPI.  Physical Exam Updated Vital Signs Pulse 69   Temp 98 F (36.7 C) (Temporal)   Resp 16   Ht 5\' 5"  (1.651 m)   Wt 77.1 kg   SpO2 96%   BMI 28.29 kg/m   Physical Exam Vitals and nursing note reviewed.  Constitutional:      General: He is not in acute distress.    Appearance: He is well-developed.  HENT:     Head: Normocephalic and atraumatic.  Eyes:     General:        Right eye: No discharge.        Left eye: No discharge.     Conjunctiva/sclera: Conjunctivae normal.  Cardiovascular:     Rate and Rhythm:  Normal rate and regular rhythm.     Heart sounds: Normal heart sounds. No murmur heard.  No friction rub. No gallop.   Pulmonary:     Effort: Pulmonary effort is normal. No respiratory distress.     Breath sounds: Normal breath sounds.  Abdominal:     General: There is no distension.     Palpations: Abdomen is soft.     Tenderness: There is no abdominal tenderness.  Musculoskeletal:        General: No tenderness.     Cervical back: Neck supple.     Comments: Silver dollar sized fluctuant lesion consistent with an abscess to the mid aspect of the volar right forearm.  Some mild surrounding cellulitis.  No drainage.  Skin:    General: Skin is warm and dry.  Neurological:     Mental Status: He is alert.  Psychiatric:        Behavior: Behavior normal.        Thought Content: Thought content normal.     ED Results / Procedures  / Treatments   Labs (all labs ordered are listed, but only abnormal results are displayed) Labs Reviewed - No data to display  EKG None  Radiology No results found.  Procedures Procedures (including critical care time)  INCISION AND DRAINAGE Performed by: Raeford Razor Consent: Verbal consent obtained. Risks and benefits: risks, benefits and alternatives were discussed Type: abscess  Body area: R forearm Anesthesia: local infiltration  Incision was made with a scalpel.  Local anesthetic: lidocaine 2% w/ epinephrine  Anesthetic total: 3 ml  Complexity: complex Blunt dissection to break up loculations  Drainage: purulent  Drainage amount: large Patient tolerance: Patient tolerated the procedure well with no immediate complications.     Medications Ordered in ED Medications  ondansetron (ZOFRAN-ODT) disintegrating tablet 4 mg (4 mg Oral Given 01/15/20 1742)  ibuprofen (ADVIL) tablet 600 mg (600 mg Oral Given 01/15/20 1743)  sulfamethoxazole-trimethoprim (BACTRIM DS) 800-160 MG per tablet 1 tablet (1 tablet Oral Given 01/15/20 1742)  lidocaine-EPINEPHrine (XYLOCAINE W/EPI) 2 %-1:200000 (PF) injection 10 mL (10 mLs Other Given by Other 01/15/20 1753)    ED Course  I have reviewed the triage vital signs and the nursing notes.  Pertinent labs & imaging results that were available during my care of the patient were reviewed by me and considered in my medical decision making (see chart for details).    MDM Rules/Calculators/A&P                         49 year old male with cutaneous abscess in the setting of IV drug use.  Incised and drained.  Mild surrounding cellulitis.  Placed on Bactrim.  Return precautions and continued wound care discussed.  Resources provided for substance abuse.  Final Clinical Impression(s) / ED Diagnoses Final diagnoses:  Abscess    Rx / DC Orders ED Discharge Orders         Ordered    sulfamethoxazole-trimethoprim (BACTRIM DS) 800-160  MG tablet  2 times daily     Discontinue  Reprint     01/15/20 1856           Raeford Razor, MD 01/19/20 2046

## 2020-02-04 ENCOUNTER — Other Ambulatory Visit: Payer: Self-pay

## 2020-02-04 ENCOUNTER — Encounter (HOSPITAL_COMMUNITY): Payer: Self-pay | Admitting: Emergency Medicine

## 2020-02-04 ENCOUNTER — Emergency Department (HOSPITAL_COMMUNITY)
Admission: EM | Admit: 2020-02-04 | Discharge: 2020-02-04 | Disposition: A | Discharge status: Home / Self Care | Payer: Self-pay | Attending: Emergency Medicine | Admitting: Emergency Medicine

## 2020-02-04 DIAGNOSIS — R110 Nausea: Secondary | ICD-10-CM | POA: Insufficient documentation

## 2020-02-04 DIAGNOSIS — L02413 Cutaneous abscess of right upper limb: Secondary | ICD-10-CM | POA: Insufficient documentation

## 2020-02-04 DIAGNOSIS — Z79899 Other long term (current) drug therapy: Secondary | ICD-10-CM | POA: Insufficient documentation

## 2020-02-04 DIAGNOSIS — I10 Essential (primary) hypertension: Secondary | ICD-10-CM | POA: Insufficient documentation

## 2020-02-04 DIAGNOSIS — L0291 Cutaneous abscess, unspecified: Secondary | ICD-10-CM

## 2020-02-04 DIAGNOSIS — F17210 Nicotine dependence, cigarettes, uncomplicated: Secondary | ICD-10-CM | POA: Insufficient documentation

## 2020-02-04 MED ORDER — HYDROXYZINE HCL 25 MG PO TABS: 50.0000 mg | ORAL_TABLET | Freq: Four times a day (QID) | ORAL | 0 refills | Status: AC

## 2020-02-04 MED ORDER — DOXYCYCLINE HYCLATE 100 MG PO TABS
100.0000 mg | ORAL_TABLET | Freq: Once | ORAL | Status: AC
  Administered 2020-02-04: 100 mg via ORAL
  Filled 2020-02-04: qty 1

## 2020-02-04 MED ORDER — LIDOCAINE HCL (PF) 2 % IJ SOLN: 5.0000 mL | Freq: Once | INTRAMUSCULAR | Status: DC

## 2020-02-04 MED ORDER — POVIDONE-IODINE 10 % EX SOLN
CUTANEOUS | Status: DC | PRN
  Filled 2020-02-04: qty 15

## 2020-02-04 MED ORDER — DOXYCYCLINE HYCLATE 100 MG PO CAPS: 100.0000 mg | ORAL_CAPSULE | Freq: Two times a day (BID) | ORAL | 0 refills | Status: AC

## 2020-02-04 MED ORDER — LIDOCAINE HCL (PF) 1 % IJ SOLN
INTRAMUSCULAR | Status: AC
  Administered 2020-02-04: 30 mL
  Filled 2020-02-04: qty 30

## 2020-02-04 MED ORDER — PENTAFLUOROPROP-TETRAFLUOROETH EX AERO
INHALATION_SPRAY | CUTANEOUS | Status: DC | PRN
  Filled 2020-02-04: qty 116

## 2020-02-04 MED ORDER — HYDROXYZINE HCL 25 MG PO TABS
50.0000 mg | ORAL_TABLET | Freq: Once | ORAL | Status: AC
  Administered 2020-02-04: 50 mg via ORAL
  Filled 2020-02-04: qty 2

## 2020-02-04 NOTE — ED Provider Notes (Signed)
[facility] EMERGENCY DEPARTMENT Provider Note   CSN: [id number] Arrival date & time: 02/04/20  1337     History Chief Complaint  Patient presents with  . Abscess    Gerald J Jagiello Montez Hageman. is a 49 y.o. male with a history of hypertension, hepatitis C and polysubstance abuse, last used heroin over 1 week ago, stating he has enrolled in the methadone clinic this week and is motivated to stop using heroin.  However he has an abscess on his right antecubital space from his last injection site.  He was seen here several weeks ago on July 22 where he had an I&D of the same forearm but more distally which he states has healed prior to developing this new site.  He completed a 5-day course of Bactrim with this prior abscess.  He denies fevers or chills.  He does endorse occasional nausea episodes, no vomiting.  However, he also missed the methadone clinic this morning as he had a take his father to doctor's appointment, probably the reason for him having nausea.  He had significant pain especially with range of motion of the wrist and elbow.  There is been no drainage from the site.  He has found no alleviators for his symptoms.  HPI     Past Medical History:  Diagnosis Date  . Chronic back pain   . Hepatitis C   . Hx of medication noncompliance   . Hypertension   . Polysubstance abuse (HCC)    benzos, opiates, cocaine, mariuana    Patient Active Problem List   Diagnosis Date Noted  . Acute on chronic cholecystitis 04/28/2015  . Leukocytosis 04/28/2015  . Tobacco abuse 04/28/2015  . Acute cholecystitis 04/28/2015    Past Surgical History:  Procedure Laterality Date  . CHOLECYSTECTOMY N/A 04/30/2015   Procedure: LAPAROSCOPIC CHOLECYSTECTOMY;  Surgeon: Franky Macho, MD;  Location: [facility];  Service: General;  Laterality: N/A;  . HERNIA REPAIR         History reviewed. No pertinent family history.  Social History   Tobacco Use  . Smoking status: Current Every Day Smoker     Packs/day: 2.00    Types: Cigarettes  . Smokeless tobacco: Never Used  Vaping Use  . Vaping Use: Former  Substance Use Topics  . Alcohol use: No  . Drug use: Yes    Types: Marijuana, Cocaine    Comment: heroin last used last night     Home Medications Prior to Admission medications   Medication Sig Start Date End Date Taking? Authorizing Provider  cloNIDine (CATAPRES) 0.1 MG tablet Take 1 tablet (0.1 mg total) by mouth 2 (two) times daily. 08/01/18 08/01/19  Elson Areas, PA-C  doxycycline (VIBRAMYCIN) 100 MG capsule Take 1 capsule (100 mg total) by mouth 2 (two) times daily. 02/04/20   Burgess Amor, PA-C  hydrOXYzine (ATARAX/VISTARIL) 25 MG tablet Take 2 tablets (50 mg total) by mouth every 6 (six) hours. 02/04/20   Burgess Amor, PA-C  lisinopril (PRINIVIL,ZESTRIL) 10 MG tablet Take 1 tablet (10 mg total) by mouth daily. 08/01/18 08/01/19  Elson Areas, PA-C  methocarbamol (ROBAXIN) 500 MG tablet Take 1 tablet (500 mg total) by mouth 3 (three) times daily. 09/30/18   Ivery Quale, PA-C  oseltamivir (TAMIFLU) 75 MG capsule Take 1 capsule (75 mg total) by mouth every 12 (twelve) hours. Patient not taking: Reported on 09/30/2018 09/12/18   Ivery Quale, PA-C    Allergies    Patient has no known allergies.  Review of  Systems   Review of Systems  Constitutional: Negative for chills and fever.  HENT: Negative for congestion and sore throat.   Eyes: Negative.   Respiratory: Negative for chest tightness and shortness of breath.   Cardiovascular: Negative for chest pain.  Gastrointestinal: Positive for nausea. Negative for abdominal pain.  Genitourinary: Negative.   Musculoskeletal: Negative for arthralgias, joint swelling and neck pain.  Skin: Positive for color change and wound. Negative for rash.  Neurological: Negative for dizziness, weakness, light-headedness, numbness and headaches.  Psychiatric/Behavioral: Negative.     Physical Exam Updated Vital Signs BP 132/82 (BP Location:  Right Arm)   Pulse 90   Temp 98.5 F (36.9 C) (Oral)   Resp 18   Ht 5\' 5"  (1.651 m)   Wt 79.4 kg   SpO2 96%   BMI 29.12 kg/m   Physical Exam Vitals and nursing note reviewed.  Constitutional:      Appearance: He is well-developed.  HENT:     Head: Normocephalic and atraumatic.  Eyes:     Conjunctiva/sclera: Conjunctivae normal.  Cardiovascular:     Rate and Rhythm: Normal rate.  Pulmonary:     Effort: Pulmonary effort is normal.     Breath sounds: No wheezing.  Musculoskeletal:        General: Normal range of motion.     Cervical back: Normal range of motion.  Skin:    General: Skin is warm and dry.     Findings: Erythema present.     Comments: Raised abscess with central fluctuance measuring 2 cm at the right antecubital space.  There is a 3 cm surrounding erythema without red streaking.  He has no epitrochlear or axillary adenopathy.  Neurological:     Mental Status: He is alert.     ED Results / Procedures / Treatments   Labs (all labs ordered are listed, but only abnormal results are displayed) Labs Reviewed  AEROBIC CULTURE (SUPERFICIAL SPECIMEN)    EKG None  Radiology No results found.  Procedures Procedures (including critical care time)  INCISION AND DRAINAGE Performed by: Consent: Verbal consent obtained. Risks and benefits: risks, benefits and alternatives were discussed Type: abscess  Body area: right antecubital space  Anesthesia: local infiltration, also used [employer]ers Pain Ease freeze spray  Incision was made with a scalpel.  Local anesthetic: lidocaine 1% without epinephrine  Anesthetic total: 3 ml  Complexity: complex Blunt dissection to break up loculations, also flushed copiously using normal saline.  Drainage: purulent  Drainage amount: copious  Packing material: none  Patient tolerance: Patient tolerated the procedure well with no immediate complications.     Medications Ordered in ED Medications    lidocaine HCl (PF) (XYLOCAINE) 2 % injection 5 mL (has no administration in time range)  povidone-iodine (BETADINE) 10 % external solution ( Topical Given 02/04/20 1635)  pentafluoroprop-tetrafluoroeth (GEBAUERS) aerosol (has no administration in time range)  lidocaine (PF) (XYLOCAINE) 1 % injection (30 mLs  Given 02/04/20 1635)  doxycycline (VIBRA-TABS) tablet 100 mg (100 mg Oral Given 02/04/20 1635)  hydrOXYzine (ATARAX/VISTARIL) tablet 50 mg (50 mg Oral Given 02/04/20 1636)    ED Course  I have reviewed the triage vital signs and the nursing notes.  Pertinent labs & imaging results that were available during my care of the patient were reviewed by me and considered in my medical decision making (see chart for details).    MDM Rules/Calculators/A&P  Patient advised to continue warm soaks with gentle massage keeping the site open and draining.  He was placed on a 10-day course of doxycycline.  He was also given a short course of hydroxyzine to help him with his heroin withdrawal symptoms.  He was encouraged to proceed to the methadone clinic tomorrow morning.  Return precautions were outlined. Final Clinical Impression(s) / ED Diagnoses Final diagnoses:  Abscess    Rx / DC Orders ED Discharge Orders         Ordered    doxycycline (VIBRAMYCIN) 100 MG capsule  2 times daily     Discontinue  Reprint     02/04/20 1621    hydrOXYzine (ATARAX/VISTARIL) 25 MG tablet  Every 6 hours     Discontinue  Reprint     02/04/20 1621           Burgess Amor, PA-C 02/04/20 1732    Geoffery Lyons, MD 02/05/20 1426

## 2020-02-04 NOTE — ED Triage Notes (Addendum)
Pt reports abscess to right AC x3 weeks. Pt reports was seen for abscess on lower right forearm 2 weeks ago and was given 5 days of abx. Pt denies any improvement in Right AC.Pt reports intermittent nausea.

## 2020-02-04 NOTE — Discharge Instructions (Addendum)
Complete the entire course of antibiotics prescribed taking your next dose tomorrow morning.  You may also use the hydroxyzine prescribed if you choose to, this can help your narcotic withdrawal symptoms until you can get back to the methadone clinic tomorrow.  This may make you little drowsy however.  Apply warm soaks/shower massage to the site keeping it open and draining for several more days while it continues to heal.  Get rechecked for any new or worsening symptoms.

## 2020-02-07 LAB — AEROBIC CULTURE  (SUPERFICIAL SPECIMEN)

## 2020-02-07 LAB — AEROBIC CULTURE W GRAM STAIN (SUPERFICIAL SPECIMEN)
Gram Stain: NONE SEEN
Special Requests: NORMAL

## 2020-02-08 ENCOUNTER — Telehealth: Payer: Self-pay | Admitting: Emergency Medicine

## 2020-02-08 NOTE — ED Notes (Signed)
Pt called and reported he can't afford the doxycyline and requested antibiotic be changed to bactrim.  Notified PA K. Sophia and she changed prescription to bactrim DS 1 po bid dispense 20.  Prescription called in to [facility] in [place] as requested by pt.

## 2020-02-08 NOTE — Telephone Encounter (Signed)
Post ED Visit - Positive Culture Follow-up  Culture report reviewed by antimicrobial stewardship pharmacist: [facility] []  , Pharm.D. []  Enzo Bi, Pharm.D., BCPS AQ-ID []  , Pharm.D., BCPS []  Celedonio Miyamoto, Pharm.D., BCPS []  Keeler, Garvin Fila.D., BCPS, AAHIVP []  , Pharm.D., BCPS, AAHIVP []  Georgina Pillion, PharmD, BCPS []  , PharmD, BCPS []  [place], PharmD, BCPS [x]  [address], PharmD []  , PharmD, BCPS []  Estella Husk, PharmD  Pharmacy Team []  Lysle Pearl, PharmD []  , PharmD []  Phillips Climes, PharmD []  , Rph []  Agapito Games) , PharmD []  Joaquim Lai, PharmD []  , PharmD []  Mervyn Gay, PharmD []  , PharmD []  Vinnie Level, PharmD []  [facility], PharmD []  , PharmD []  Len Childs, PharmD   Positive Aerobic culture Treated with Doxycycline, organism sensitive to the same and no further patient follow-up is required at this time.  02/08/2020, 6:32 PM

## 2021-03-05 ENCOUNTER — Emergency Department (HOSPITAL_COMMUNITY)
Admission: EM | Admit: 2021-03-05 | Discharge: 2021-03-05 | Disposition: A | Discharge status: Home / Self Care | Payer: Self-pay | Attending: Emergency Medicine | Admitting: Emergency Medicine

## 2021-03-05 ENCOUNTER — Other Ambulatory Visit: Payer: Self-pay

## 2021-03-05 ENCOUNTER — Encounter (HOSPITAL_COMMUNITY): Payer: Self-pay

## 2021-03-05 DIAGNOSIS — L02424 Furuncle of left upper limb: Secondary | ICD-10-CM | POA: Insufficient documentation

## 2021-03-05 DIAGNOSIS — L02423 Furuncle of right upper limb: Secondary | ICD-10-CM | POA: Insufficient documentation

## 2021-03-05 DIAGNOSIS — L989 Disorder of the skin and subcutaneous tissue, unspecified: Secondary | ICD-10-CM | POA: Insufficient documentation

## 2021-03-05 DIAGNOSIS — Z5321 Procedure and treatment not carried out due to patient leaving prior to being seen by health care provider: Secondary | ICD-10-CM | POA: Insufficient documentation

## 2021-03-05 NOTE — ED Triage Notes (Addendum)
Pt says has several boils under both arms and has 2 sores on his scalp x 2 weeks.

## 2021-03-05 NOTE — ED Provider Notes (Signed)
Emergency Medicine Provider Triage Evaluation Note  Gerald Moore. , a 50 y.o. male  was evaluated in triage.  Pt complains of bumps on top of head and occiput and soreness to the head that began weeks ago. No new detergents, hair products. Denies itching, fevers. Never happened before.   Review of Systems  Positive: Wounds  Negative: Fever, itchiness, rash   Physical Exam  BP (!) 199/161 (BP Location: Right Arm)   Pulse 83   Temp 98.2 F (36.8 C) (Oral)   Resp 18   Ht 5\' 6"  (1.676 m)   Wt 78.6 kg   SpO2 96%   BMI 27.98 kg/m  Gen:   Awake, no distress   Resp:  Normal effort  HENT:  3cm raised lesion on left parietal area of head. Appears red, inflammed with tenderness. Central area of eschar. Small 0.5cm papule on right occiput   Medical Decision Making  Medically screening exam initiated at 11:53 AM.  Appropriate orders placed.  Gerald Moore . was informed that the remainder of the evaluation will be completed by another provider, this initial triage assessment does not replace that evaluation, and the importance of remaining in the ED until their evaluation is complete.     [employer], PA-C 03/05/21 1159    05/05/21, MD 03/06/21 [phone]

## 2023-04-30 ENCOUNTER — Emergency Department (HOSPITAL_COMMUNITY): Admission: EM | Admit: 2023-04-30 | Discharge: 2023-05-01 | Payer: Self-pay

## 2023-04-30 ENCOUNTER — Emergency Department (HOSPITAL_COMMUNITY): Payer: Self-pay

## 2023-04-30 DIAGNOSIS — I10 Essential (primary) hypertension: Secondary | ICD-10-CM | POA: Insufficient documentation

## 2023-04-30 DIAGNOSIS — F17200 Nicotine dependence, unspecified, uncomplicated: Secondary | ICD-10-CM | POA: Insufficient documentation

## 2023-04-30 DIAGNOSIS — Z79899 Other long term (current) drug therapy: Secondary | ICD-10-CM | POA: Insufficient documentation

## 2023-04-30 DIAGNOSIS — J168 Pneumonia due to other specified infectious organisms: Secondary | ICD-10-CM | POA: Insufficient documentation

## 2023-04-30 DIAGNOSIS — Z20822 Contact with and (suspected) exposure to covid-19: Secondary | ICD-10-CM | POA: Insufficient documentation

## 2023-04-30 DIAGNOSIS — J189 Pneumonia, unspecified organism: Secondary | ICD-10-CM

## 2023-04-30 LAB — SARS CORONAVIRUS 2 BY RT PCR: SARS Coronavirus 2 by RT PCR: NEGATIVE

## 2023-04-30 LAB — CBC
HCT: 37 % — ABNORMAL LOW (ref 39.0–52.0)
Hemoglobin: 12.3 g/dL — ABNORMAL LOW (ref 13.0–17.0)
MCH: 29.9 pg (ref 26.0–34.0)
MCHC: 33.2 g/dL (ref 30.0–36.0)
MCV: 90 fL (ref 80.0–100.0)
Platelets: 199 10*3/uL (ref 150–400)
RBC: 4.11 MIL/uL — ABNORMAL LOW (ref 4.22–5.81)
RDW: 14.1 % (ref 11.5–15.5)
WBC: 15.3 10*3/uL — ABNORMAL HIGH (ref 4.0–10.5)
nRBC: 0 % (ref 0.0–0.2)

## 2023-04-30 LAB — BASIC METABOLIC PANEL
Anion gap: 9 (ref 5–15)
BUN: 17 mg/dL (ref 6–20)
CO2: 22 mmol/L (ref 22–32)
Calcium: 9 mg/dL (ref 8.9–10.3)
Chloride: 106 mmol/L (ref 98–111)
Creatinine, Ser: 1.04 mg/dL (ref 0.61–1.24)
GFR, Estimated: >60 mL/min (ref >60)
Glucose, Bld: 135 mg/dL — ABNORMAL HIGH (ref 70–99)
Potassium: 3.7 mmol/L (ref 3.5–5.1)
Sodium: 137 mmol/L (ref 135–145)

## 2023-04-30 LAB — TROPONIN I (HIGH SENSITIVITY)
Troponin I (High Sensitivity): 10 ng/L (ref <18)
Troponin I (High Sensitivity): 9 ng/L (ref <18)

## 2023-04-30 LAB — D-DIMER, QUANTITATIVE: D-Dimer, Quant: >20 ug{FEU}/mL — ABNORMAL HIGH (ref 0.00–0.50)

## 2023-04-30 MED ORDER — KETOROLAC TROMETHAMINE 15 MG/ML IJ SOLN
15.0000 mg | Freq: Once | INTRAMUSCULAR | Status: AC
  Administered 2023-04-30: 15 mg via INTRAVENOUS
  Filled 2023-04-30: qty 1

## 2023-04-30 MED ORDER — ALBUTEROL SULFATE HFA 108 (90 BASE) MCG/ACT IN AERS
2.0000 | INHALATION_SPRAY | RESPIRATORY_TRACT | 0 refills | Status: AC | PRN
Start: 2023-04-30 — End: ?

## 2023-04-30 MED ORDER — AMOXICILLIN-POT CLAVULANATE 875-125 MG PO TABS
1.0000 | ORAL_TABLET | Freq: Once | ORAL | Status: AC
  Administered 2023-05-01: 1 via ORAL
  Filled 2023-04-30: qty 1

## 2023-04-30 MED ORDER — DOXYCYCLINE HYCLATE 100 MG PO TABS
100.0000 mg | ORAL_TABLET | Freq: Once | ORAL | Status: AC
  Administered 2023-05-01: 100 mg via ORAL
  Filled 2023-04-30: qty 1

## 2023-04-30 MED ORDER — NAPROXEN 500 MG PO TABS: 500.0000 mg | ORAL_TABLET | Freq: Two times a day (BID) | ORAL | 0 refills | Status: AC

## 2023-04-30 MED ORDER — DOXYCYCLINE HYCLATE 100 MG PO CAPS: 100.0000 mg | ORAL_CAPSULE | Freq: Two times a day (BID) | ORAL | 0 refills | Status: AC

## 2023-04-30 MED ORDER — IPRATROPIUM-ALBUTEROL 0.5-2.5 (3) MG/3ML IN SOLN
3.0000 mL | Freq: Once | RESPIRATORY_TRACT | Status: AC
  Administered 2023-04-30: 3 mL via RESPIRATORY_TRACT
  Filled 2023-04-30: qty 3

## 2023-04-30 MED ORDER — AMOXICILLIN-POT CLAVULANATE 875-125 MG PO TABS: 1.0000 | ORAL_TABLET | Freq: Two times a day (BID) | ORAL | 0 refills | Status: AC

## 2023-04-30 MED ORDER — IOHEXOL 350 MG/ML SOLN
75.0000 mL | Freq: Once | INTRAVENOUS | Status: AC | PRN
  Administered 2023-04-30: 75 mL via INTRAVENOUS

## 2023-04-30 NOTE — Discharge Instructions (Addendum)
Your CT scan shows that you have pneumonia.  Please take the antibiotics as prescribed.  Use the albuterol as needed for cough.  Take the naproxen for pain.  Your CT scan did look a little abnormal.  You will need to have repeat CT scan in 3-4 weeks after the antibiotics to ensure that this resolves.  Please return to the ER for worsening symptoms.

## 2023-04-30 NOTE — ED Notes (Signed)
To x-ray

## 2023-04-30 NOTE — ED Triage Notes (Addendum)
Pt to ED via [facility] from [facility] [place]. Pt c/o right sided to central CP that started last night but worsening today. Pt has hx of GERD but states it feels differently. Pt states pain feels like a "squeezing or stabbing pain." EMS gave 3 nitroglycerin and 324 ASA en route with EMS. Pt denies any relief post medication. Pt states he had chills last night, unsure of fever, unsure if he has been around anyone who has been sick. Pt denies any prior cardiac hx.   EMS: 128/78 100 HR 99% RA 18LFA

## 2023-04-30 NOTE — ED Provider Triage Note (Signed)
Emergency Medicine Provider Triage Evaluation Note  Gerald Moore. , a 52 y.o. male  was evaluated in triage.  Pt complains of chest pain.  Review of Systems   Physical Exam  BP 133/82   Pulse 93   Temp 97.8 F (36.6 C) (Oral)   Resp 20   SpO2 100%  Lungs clear.  No respiratory distress.  Medical Decision Making  Medically screening exam initiated at 5:43 PM.  Appropriate orders placed.  Gerald Moore. was informed that the remainder of the evaluation will be completed by another provider, this initial triage assessment does not replace that evaluation, and the importance of remaining in the ED until their evaluation is complete.  Patient chest pain chills and some shortness of breath.  Began last night.  Differential diagnosis includes infection and cardiac disease.  No known cardiac history.   Benjiman Core, MD 04/30/23 1744

## 2023-04-30 NOTE — ED Provider Notes (Signed)
[place] EMERGENCY DEPARTMENT AT [facility] Provider Note   CSN: [id number] Arrival date & time: 04/30/23  1702     History {Add pertinent medical, surgical, social history, OB history to HPI:1} Chief Complaint  Patient presents with   Chest Pain    Gerald Moore. is a 52 y.o. male.  52 year old male with past medical history of polysubstance abuse, hepatitis C, hypertension, and long standing tobacco use presenting the emergency department today with chest pain.  The patient states this started last night.  He states that he is having pleuritic pain when he takes a deep breath then.  He has had minimal cough with this.  He denies being around any obvious known sick contacts but is in prison currently.  He states that it is a sharp pain.  Denies any associated nausea or diaphoresis.  He denies any leg pain or swelling.  Denies any hemoptysis.  He denies a history of DVT or pulmonary embolism, recent surgeries, recent travel.   Chest Pain      Home Medications Prior to Admission medications   Medication Sig Start Date End Date Taking? Authorizing Provider  cloNIDine (CATAPRES) 0.1 MG tablet Take 1 tablet (0.1 mg total) by mouth 2 (two) times daily. 08/01/18 08/01/19  Elson Areas, PA-C  doxycycline (VIBRAMYCIN) 100 MG capsule Take 1 capsule (100 mg total) by mouth 2 (two) times daily. 02/04/20   Burgess Amor, PA-C  hydrOXYzine (ATARAX/VISTARIL) 25 MG tablet Take 2 tablets (50 mg total) by mouth every 6 (six) hours. 02/04/20   Burgess Amor, PA-C  lisinopril (PRINIVIL,ZESTRIL) 10 MG tablet Take 1 tablet (10 mg total) by mouth daily. 08/01/18 08/01/19  Elson Areas, PA-C  methocarbamol (ROBAXIN) 500 MG tablet Take 1 tablet (500 mg total) by mouth 3 (three) times daily. 09/30/18   Ivery Quale, PA-C  oseltamivir (TAMIFLU) 75 MG capsule Take 1 capsule (75 mg total) by mouth every 12 (twelve) hours. Patient not taking: Reported on 09/30/2018 09/12/18   Ivery Quale, PA-C       Allergies    Patient has no known allergies.    Review of Systems   Review of Systems  Cardiovascular:  Positive for chest pain.  All other systems reviewed and are negative.   Physical Exam Updated Vital Signs BP 133/82   Pulse 93   Temp 97.8 F (36.6 C) (Oral)   Resp 20   SpO2 100%  Physical Exam Vitals and nursing note reviewed.   Gen: NAD Eyes: PERRL, EOMI HEENT: no oropharyngeal swelling Neck: trachea midline Resp: Wheezes with some coarse breath sounds in the right lung field, faint wheezes on the left Card: RRR, no murmurs, rubs, or gallops Abd: nontender, nondistended Extremities: no calf tenderness, no edema Vascular: 2+ radial pulses bilaterally, 2+ DP pulses bilaterally Skin: no rashes Psyc: acting appropriately   ED Results / Procedures / Treatments   Labs (all labs ordered are listed, but only abnormal results are displayed) Labs Reviewed  CBC - Abnormal; Notable for the following components:      Result Value   WBC 15.3 (*)    RBC 4.11 (*)    Hemoglobin 12.3 (*)    HCT 37.0 (*)    All other components within normal limits  SARS CORONAVIRUS 2 BY RT PCR  BASIC METABOLIC PANEL  D-DIMER, QUANTITATIVE  TROPONIN I (HIGH SENSITIVITY)    EKG EKG Interpretation Date/Time:  Monday April 30 2023 17:05:32 EST Ventricular Rate:  99 PR Interval:  150 QRS Duration:  80 QT Interval:  332 QTC Calculation: 426 R Axis:   0  Text Interpretation: Normal sinus rhythm Septal infarct , age undetermined Abnormal ECG When compared with ECG of 01-Aug-2018 10:01, No significant change since last tracing Confirmed by Benjiman Core [phone]) on 04/30/2023 5:28:39 PM  Radiology No results found.  Procedures Procedures  {Document cardiac monitor, telemetry assessment procedure when appropriate:1}  Medications Ordered in ED Medications  ipratropium-albuterol (DUONEB) 0.5-2.5 (3) MG/3ML nebulizer solution 3 mL (has no administration in time range)   ketorolac (TORADOL) 15 MG/ML injection 15 mg (has no administration in time range)    ED Course/ Medical Decision Making/ A&P   {   Click here for ABCD2, HEART and other calculatorsREFRESH Note before signing :1}                              Medical Decision Making 52 year old male with past medical history of hypertension, hepatitis C, and polysubstance abuse and longstanding tobacco abuse presenting to the emergency department today with chest pain.  I will further evaluate the patient here with basic labs Wels and EKG, chest x-ray, and troponin to further evaluate for ACS, pulmonary edema, pulmonary infiltrates, or pneumothorax.  I will obtain a D-dimer here to evaluate for pulmonary embolism.  I give the patient Toradol in the event this due to pleurisy.  Will give him a DuoNeb here as well as does have some wheezing here on exam.  This may be due to bronchitis or pneumonia given the extraneous breath sounds.  I will reevaluate for ultimate disposition.  Amount and/or Complexity of Data Reviewed Labs: ordered. Radiology: ordered.  Risk Prescription drug management.   ***  {Document critical care time when appropriate:1} {Document review of labs and clinical decision tools ie heart score, Chads2Vasc2 etc:1}  {Document your independent review of radiology images, and any outside records:1} {Document your discussion with family members, caretakers, and with consultants:1} {Document social determinants of health affecting pt's care:1} {Document your decision making why or why not admission, treatments were needed:1} Final Clinical Impression(s) / ED Diagnoses Final diagnoses:  None    Rx / DC Orders ED Discharge Orders     None
